# Patient Record
Sex: Male | Born: 1960
Health system: Southern US, Community
[De-identification: ages and names within clinical notes are randomized; demographics above are authoritative.]

## PROBLEM LIST (undated history)

## (undated) DIAGNOSIS — K219 Gastro-esophageal reflux disease without esophagitis: Secondary | ICD-10-CM

## (undated) DIAGNOSIS — J40 Bronchitis, not specified as acute or chronic: Secondary | ICD-10-CM

## (undated) DIAGNOSIS — R51 Headache: Secondary | ICD-10-CM

## (undated) DIAGNOSIS — M1990 Unspecified osteoarthritis, unspecified site: Secondary | ICD-10-CM

---

## 1999-02-02 ENCOUNTER — Emergency Department (HOSPITAL_COMMUNITY): Admission: EM | Admit: 1999-02-02 | Discharge: 1999-02-02 | Payer: Self-pay | Admitting: Emergency Medicine

## 2001-02-08 ENCOUNTER — Emergency Department (HOSPITAL_COMMUNITY): Admission: EM | Admit: 2001-02-08 | Discharge: 2001-02-08 | Payer: Self-pay

## 2001-07-22 ENCOUNTER — Emergency Department (HOSPITAL_COMMUNITY): Admission: EM | Admit: 2001-07-22 | Discharge: 2001-07-22 | Payer: Self-pay

## 2002-10-27 ENCOUNTER — Encounter: Payer: Self-pay | Admitting: Emergency Medicine

## 2002-10-27 ENCOUNTER — Emergency Department (HOSPITAL_COMMUNITY): Admission: EM | Admit: 2002-10-27 | Discharge: 2002-10-27 | Payer: Self-pay | Admitting: *Deleted

## 2007-08-28 ENCOUNTER — Emergency Department (HOSPITAL_COMMUNITY): Admission: EM | Admit: 2007-08-28 | Discharge: 2007-08-28 | Payer: Self-pay | Admitting: Emergency Medicine

## 2011-04-17 ENCOUNTER — Encounter: Payer: Self-pay | Admitting: *Deleted

## 2011-04-17 ENCOUNTER — Emergency Department (HOSPITAL_COMMUNITY)
Admission: EM | Admit: 2011-04-17 | Discharge: 2011-04-17 | Disposition: A | Discharge status: Home / Self Care | Payer: PRIVATE HEALTH INSURANCE | Attending: Emergency Medicine | Admitting: Emergency Medicine

## 2011-04-17 ENCOUNTER — Emergency Department (HOSPITAL_COMMUNITY): Payer: PRIVATE HEALTH INSURANCE

## 2011-04-17 DIAGNOSIS — M25559 Pain in unspecified hip: Secondary | ICD-10-CM | POA: Insufficient documentation

## 2011-04-17 DIAGNOSIS — M1610 Unilateral primary osteoarthritis, unspecified hip: Secondary | ICD-10-CM | POA: Insufficient documentation

## 2011-04-17 DIAGNOSIS — W010XXA Fall on same level from slipping, tripping and stumbling without subsequent striking against object, initial encounter: Secondary | ICD-10-CM | POA: Insufficient documentation

## 2011-04-17 MED ORDER — NAPROXEN 500 MG PO TABS: 500.0000 mg | ORAL_TABLET | Freq: Two times a day (BID) | ORAL | Status: DC
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
## 2011-04-17 NOTE — ED Provider Notes (Signed)
Medical screening examination/treatment/procedure(s) were performed by non-physician practitioner and as supervising physician I was immediately available for consultation/collaboration.  Nelia Shi, MD 04/17/11 [phone]

## 2011-04-17 NOTE — ED Notes (Signed)
Patient is resting comfortably. Back from xray  
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
## 2011-05-04 ENCOUNTER — Other Ambulatory Visit (HOSPITAL_COMMUNITY): Payer: Self-pay | Admitting: Orthopaedic Surgery

## 2011-05-18 ENCOUNTER — Other Ambulatory Visit (HOSPITAL_COMMUNITY): Payer: Self-pay | Admitting: Orthopaedic Surgery

## 2011-05-28 ENCOUNTER — Encounter (HOSPITAL_COMMUNITY): Payer: Self-pay

## 2011-06-03 ENCOUNTER — Encounter (HOSPITAL_COMMUNITY)
Admission: RE | Admit: 2011-06-03 | Discharge: 2011-06-03 | Disposition: A | Discharge status: Home / Self Care | Payer: PRIVATE HEALTH INSURANCE | Source: Ambulatory Visit | Attending: Orthopaedic Surgery | Admitting: Orthopaedic Surgery

## 2011-06-03 ENCOUNTER — Other Ambulatory Visit: Payer: Self-pay

## 2011-06-03 ENCOUNTER — Encounter (HOSPITAL_COMMUNITY): Payer: Self-pay

## 2011-06-03 ENCOUNTER — Ambulatory Visit (HOSPITAL_COMMUNITY)
Admission: RE | Admit: 2011-06-03 | Discharge: 2011-06-03 | Disposition: A | Discharge status: Home / Self Care | Payer: PRIVATE HEALTH INSURANCE | Source: Ambulatory Visit | Attending: Orthopaedic Surgery | Admitting: Orthopaedic Surgery

## 2011-06-03 DIAGNOSIS — J40 Bronchitis, not specified as acute or chronic: Secondary | ICD-10-CM

## 2011-06-03 DIAGNOSIS — K219 Gastro-esophageal reflux disease without esophagitis: Secondary | ICD-10-CM

## 2011-06-03 DIAGNOSIS — R51 Headache: Secondary | ICD-10-CM

## 2011-06-03 DIAGNOSIS — M1990 Unspecified osteoarthritis, unspecified site: Secondary | ICD-10-CM

## 2011-06-03 HISTORY — DX: Headache: R51

## 2011-06-03 HISTORY — DX: Bronchitis, not specified as acute or chronic: J40

## 2011-06-03 HISTORY — DX: Unspecified osteoarthritis, unspecified site: M19.90

## 2011-06-03 HISTORY — DX: Gastro-esophageal reflux disease without esophagitis: K21.9

## 2011-06-03 LAB — SURGICAL PCR SCREEN
MRSA, PCR: NEGATIVE
Staphylococcus aureus: NEGATIVE

## 2011-06-03 LAB — URINALYSIS, ROUTINE W REFLEX MICROSCOPIC
Bilirubin Urine: NEGATIVE
Leukocytes, UA: NEGATIVE
Nitrite: NEGATIVE
Specific Gravity, Urine: 1.009 (ref 1.005–1.030)
pH: 5.5 (ref 5.0–8.0)

## 2011-06-03 LAB — BASIC METABOLIC PANEL
CO2: 30 mEq/L (ref 19–32)
Calcium: 10.3 mg/dL (ref 8.4–10.5)
Chloride: 103 mEq/L (ref 96–112)
GFR calc Af Amer: >90 mL/min (ref >90)
Sodium: 141 mEq/L (ref 135–145)

## 2011-06-03 LAB — CBC
HCT: 42.2 % (ref 39.0–52.0)
MCV: 89.6 fL (ref 78.0–100.0)
Platelets: 283 10*3/uL (ref 150–400)
RBC: 4.71 MIL/uL (ref 4.22–5.81)
WBC: 5.4 10*3/uL (ref 4.0–10.5)

## 2011-06-03 LAB — ABO/RH: ABO/RH(D): O POS

## 2011-06-03 LAB — TYPE AND SCREEN: ABO/RH(D): O POS

## 2011-06-03 NOTE — Patient Instructions (Signed)
20 Keith Mejia  06/03/2011   Your procedure is scheduled on: 06-04-11  Report to [facility] at 0815 AM.  Call this number if you have problems the morning of surgery: [phone]   Remember:   Do not eat food:After Midnight.  May have clear liquids:until Midnight .  Clear liquids include soda, tea, black coffee, apple or grape juice, broth.  Take these medicines the morning of surgery with A SIP OF WATER:none.   Do not wear jewelry, make-up or nail polish.  Do not wear lotions, powders, or perfumes. You may wear deodorant.  Do not shave 48 hours prior to surgery.  Do not bring valuables to the hospital.  Contacts, dentures or bridgework may not be worn into surgery.  Leave suitcase in the car. After surgery it may be brought to your room.  For patients admitted to the hospital, checkout time is 11:00 AM the day of discharge.   Patients discharged the day of surgery will not be allowed to drive home.  Name and phone number of your driver:Keith Mejia, spouse [phone]  Special Instructions: CHG Shower Use Special Wash: 1/2 bottle night before surgery and 1/2 bottle morning of surgery.   Please read over the following fact sheets that you were given: Blood Transfusion Information and MRSA Information

## 2011-06-03 NOTE — Pre-Procedure Instructions (Signed)
06-03-11 EKG done CXR done today. Hip replacement booklet given.

## 2011-06-04 ENCOUNTER — Inpatient Hospital Stay (HOSPITAL_COMMUNITY): Payer: PRIVATE HEALTH INSURANCE

## 2011-06-04 ENCOUNTER — Encounter (HOSPITAL_COMMUNITY)
Admission: RE | Disposition: A | Discharge status: Home Health | Payer: Self-pay | Source: Ambulatory Visit | Attending: Orthopaedic Surgery

## 2011-06-04 ENCOUNTER — Inpatient Hospital Stay (HOSPITAL_COMMUNITY): Payer: PRIVATE HEALTH INSURANCE | Admitting: Anesthesiology

## 2011-06-04 ENCOUNTER — Encounter (HOSPITAL_COMMUNITY): Payer: Self-pay

## 2011-06-04 ENCOUNTER — Inpatient Hospital Stay (HOSPITAL_COMMUNITY)
Admission: RE | Admit: 2011-06-04 | Discharge: 2011-06-07 | DRG: 470 | Disposition: A | Discharge status: Home Health | Payer: PRIVATE HEALTH INSURANCE | Source: Ambulatory Visit | Attending: Orthopaedic Surgery | Admitting: Orthopaedic Surgery

## 2011-06-04 ENCOUNTER — Encounter (HOSPITAL_COMMUNITY): Payer: Self-pay | Admitting: Anesthesiology

## 2011-06-04 DIAGNOSIS — M12559 Traumatic arthropathy, unspecified hip: Principal | ICD-10-CM | POA: Diagnosis present

## 2011-06-04 DIAGNOSIS — M169 Osteoarthritis of hip, unspecified: Secondary | ICD-10-CM

## 2011-06-04 DIAGNOSIS — K219 Gastro-esophageal reflux disease without esophagitis: Secondary | ICD-10-CM | POA: Diagnosis present

## 2011-06-04 HISTORY — PX: TOTAL HIP ARTHROPLASTY: SHX124

## 2011-06-04 SURGERY — ARTHROPLASTY, HIP, TOTAL, ANTERIOR APPROACH
Anesthesia: General | Site: Hip | Laterality: Right | Wound class: Clean

## 2011-06-04 MED ORDER — ZOLPIDEM TARTRATE 5 MG PO TABS: 5.0000 mg | ORAL_TABLET | Freq: Every evening | ORAL | Status: DC | PRN

## 2011-06-04 MED ORDER — ACETAMINOPHEN 650 MG RE SUPP: 650.0000 mg | Freq: Four times a day (QID) | RECTAL | Status: DC | PRN

## 2011-06-04 MED ORDER — HYDROMORPHONE HCL PF 1 MG/ML IJ SOLN
INTRAMUSCULAR | Status: AC
  Filled 2011-06-04: qty 1

## 2011-06-04 MED ORDER — CEFAZOLIN SODIUM 1-5 GM-% IV SOLN
1.0000 g | Freq: Four times a day (QID) | INTRAVENOUS | Status: AC
  Administered 2011-06-04 – 2011-06-05 (×3): 1 g via INTRAVENOUS
  Filled 2011-06-04 (×3): qty 50

## 2011-06-04 MED ORDER — ONDANSETRON HCL 4 MG/2ML IJ SOLN
INTRAMUSCULAR | Status: DC | PRN
  Administered 2011-06-04: 4 mg via INTRAVENOUS

## 2011-06-04 MED ORDER — MIDAZOLAM HCL 5 MG/5ML IJ SOLN
INTRAMUSCULAR | Status: DC | PRN
  Administered 2011-06-04: 2 mg via INTRAVENOUS

## 2011-06-04 MED ORDER — PROMETHAZINE HCL 25 MG/ML IJ SOLN: 6.2500 mg | INTRAMUSCULAR | Status: DC | PRN

## 2011-06-04 MED ORDER — LIDOCAINE HCL (CARDIAC) 20 MG/ML IV SOLN
INTRAVENOUS | Status: DC | PRN
  Administered 2011-06-04: 100 mg via INTRAVENOUS

## 2011-06-04 MED ORDER — DIPHENHYDRAMINE HCL 12.5 MG/5ML PO ELIX: 12.5000 mg | ORAL_SOLUTION | Freq: Four times a day (QID) | ORAL | Status: DC | PRN

## 2011-06-04 MED ORDER — HYDROCODONE-ACETAMINOPHEN 5-325 MG PO TABS
1.0000 | ORAL_TABLET | ORAL | Status: DC | PRN
  Administered 2011-06-06 – 2011-06-07 (×3): 1 via ORAL
  Filled 2011-06-04 (×3): qty 1

## 2011-06-04 MED ORDER — METOCLOPRAMIDE HCL 10 MG PO TABS: 5.0000 mg | ORAL_TABLET | Freq: Three times a day (TID) | ORAL | Status: DC | PRN

## 2011-06-04 MED ORDER — DIPHENHYDRAMINE HCL 50 MG/ML IJ SOLN: 12.5000 mg | Freq: Four times a day (QID) | INTRAMUSCULAR | Status: DC | PRN

## 2011-06-04 MED ORDER — ONDANSETRON HCL 4 MG/2ML IJ SOLN: 4.0000 mg | Freq: Four times a day (QID) | INTRAMUSCULAR | Status: DC | PRN

## 2011-06-04 MED ORDER — METHOCARBAMOL 500 MG PO TABS
500.0000 mg | ORAL_TABLET | Freq: Four times a day (QID) | ORAL | Status: DC | PRN
  Administered 2011-06-05 – 2011-06-06 (×2): 500 mg via ORAL
  Filled 2011-06-04 (×2): qty 1

## 2011-06-04 MED ORDER — ALUM & MAG HYDROXIDE-SIMETH 200-200-20 MG/5ML PO SUSP: 30.0000 mL | ORAL | Status: DC | PRN

## 2011-06-04 MED ORDER — HYDROMORPHONE HCL PF 1 MG/ML IJ SOLN: 0.5000 mg | INTRAMUSCULAR | Status: DC | PRN

## 2011-06-04 MED ORDER — ACETAMINOPHEN 10 MG/ML IV SOLN
INTRAVENOUS | Status: AC
  Filled 2011-06-04: qty 100

## 2011-06-04 MED ORDER — CEFAZOLIN SODIUM-DEXTROSE 2-3 GM-% IV SOLR
2.0000 g | Freq: Once | INTRAVENOUS | Status: AC
  Administered 2011-06-04: 2 g via INTRAVENOUS

## 2011-06-04 MED ORDER — PHENOL 1.4 % MT LIQD: 1.0000 | OROMUCOSAL | Status: DC | PRN

## 2011-06-04 MED ORDER — FERROUS SULFATE 325 (65 FE) MG PO TABS
325.0000 mg | ORAL_TABLET | Freq: Three times a day (TID) | ORAL | Status: DC
  Administered 2011-06-05 – 2011-06-06 (×6): 325 mg via ORAL
  Filled 2011-06-04 (×10): qty 1

## 2011-06-04 MED ORDER — ACETAMINOPHEN 325 MG PO TABS
650.0000 mg | ORAL_TABLET | Freq: Four times a day (QID) | ORAL | Status: DC | PRN
  Administered 2011-06-05 – 2011-06-06 (×2): 650 mg via ORAL
  Filled 2011-06-04 (×2): qty 2

## 2011-06-04 MED ORDER — MORPHINE SULFATE (PF) 1 MG/ML IV SOLN
INTRAVENOUS | Status: DC
  Administered 2011-06-04: 24 mg via INTRAVENOUS
  Administered 2011-06-04: 12 mg via INTRAVENOUS
  Administered 2011-06-04: 6 mg via INTRAVENOUS
  Administered 2011-06-04: 1 mg via INTRAVENOUS
  Administered 2011-06-05: 18 mg via INTRAVENOUS
  Administered 2011-06-05: 17.65 mg via INTRAVENOUS
  Administered 2011-06-05: 1.5 mg via INTRAVENOUS
  Administered 2011-06-05: 4.5 mg via INTRAVENOUS
  Administered 2011-06-05: 16.25 mg via INTRAVENOUS
  Administered 2011-06-06: 3 mg via INTRAVENOUS
  Filled 2011-06-04 (×5): qty 25

## 2011-06-04 MED ORDER — NEOSTIGMINE METHYLSULFATE 1 MG/ML IJ SOLN
INTRAMUSCULAR | Status: DC | PRN
  Administered 2011-06-04: 5 mg via INTRAVENOUS

## 2011-06-04 MED ORDER — OXYCODONE HCL 5 MG PO TABS: 5.0000 mg | ORAL_TABLET | ORAL | Status: DC | PRN

## 2011-06-04 MED ORDER — ROCURONIUM BROMIDE 100 MG/10ML IV SOLN
INTRAVENOUS | Status: DC | PRN
  Administered 2011-06-04: 20 mg via INTRAVENOUS
  Administered 2011-06-04: 50 mg via INTRAVENOUS

## 2011-06-04 MED ORDER — DEXTROSE 5 % IV SOLN
500.0000 mg | Freq: Four times a day (QID) | INTRAVENOUS | Status: DC | PRN
  Administered 2011-06-04: 500 mg via INTRAVENOUS
  Filled 2011-06-04: qty 5

## 2011-06-04 MED ORDER — PROPOFOL 10 MG/ML IV BOLUS
INTRAVENOUS | Status: DC | PRN
  Administered 2011-06-04: 200 mg via INTRAVENOUS

## 2011-06-04 MED ORDER — ONDANSETRON HCL 4 MG PO TABS
4.0000 mg | ORAL_TABLET | Freq: Four times a day (QID) | ORAL | Status: DC | PRN
  Administered 2011-06-06: 4 mg via ORAL
  Filled 2011-06-04: qty 1

## 2011-06-04 MED ORDER — 0.9 % SODIUM CHLORIDE (POUR BTL) OPTIME
TOPICAL | Status: DC | PRN
  Administered 2011-06-04: 1000 mL

## 2011-06-04 MED ORDER — RIVAROXABAN 10 MG PO TABS
10.0000 mg | ORAL_TABLET | Freq: Every day | ORAL | Status: DC
  Administered 2011-06-05 – 2011-06-07 (×3): 10 mg via ORAL
  Filled 2011-06-04 (×3): qty 1

## 2011-06-04 MED ORDER — SODIUM CHLORIDE 0.9 % IV SOLN
INTRAVENOUS | Status: DC
  Administered 2011-06-04 (×2): via INTRAVENOUS

## 2011-06-04 MED ORDER — DIPHENHYDRAMINE HCL 12.5 MG/5ML PO ELIX: 12.5000 mg | ORAL_SOLUTION | ORAL | Status: DC | PRN

## 2011-06-04 MED ORDER — SODIUM CHLORIDE 0.9 % IJ SOLN: 9.0000 mL | INTRAMUSCULAR | Status: DC | PRN

## 2011-06-04 MED ORDER — ACETAMINOPHEN 10 MG/ML IV SOLN
INTRAVENOUS | Status: DC | PRN
  Administered 2011-06-04: 1000 mg via INTRAVENOUS

## 2011-06-04 MED ORDER — NALOXONE HCL 0.4 MG/ML IJ SOLN: 0.4000 mg | INTRAMUSCULAR | Status: DC | PRN

## 2011-06-04 MED ORDER — MENTHOL 3 MG MT LOZG: 1.0000 | LOZENGE | OROMUCOSAL | Status: DC | PRN

## 2011-06-04 MED ORDER — HYDROMORPHONE HCL PF 1 MG/ML IJ SOLN
0.2500 mg | INTRAMUSCULAR | Status: DC | PRN
  Administered 2011-06-04 (×4): 0.5 mg via INTRAVENOUS

## 2011-06-04 MED ORDER — LACTATED RINGERS IV SOLN
INTRAVENOUS | Status: DC
  Administered 2011-06-04 (×2): via INTRAVENOUS

## 2011-06-04 MED ORDER — CEFAZOLIN SODIUM-DEXTROSE 2-3 GM-% IV SOLR
INTRAVENOUS | Status: AC
  Filled 2011-06-04: qty 50

## 2011-06-04 MED ORDER — GLYCOPYRROLATE 0.2 MG/ML IJ SOLN
INTRAMUSCULAR | Status: DC | PRN
  Administered 2011-06-04: .8 mg via INTRAVENOUS

## 2011-06-04 MED ORDER — HYDROMORPHONE HCL PF 1 MG/ML IJ SOLN
INTRAMUSCULAR | Status: DC | PRN
  Administered 2011-06-04: 0.5 mg via INTRAVENOUS
  Administered 2011-06-04: 1 mg via INTRAVENOUS
  Administered 2011-06-04: 0.5 mg via INTRAVENOUS

## 2011-06-04 MED ORDER — METOCLOPRAMIDE HCL 5 MG/ML IJ SOLN
5.0000 mg | Freq: Three times a day (TID) | INTRAMUSCULAR | Status: DC | PRN
  Administered 2011-06-06: 10 mg via INTRAVENOUS
  Filled 2011-06-04: qty 2

## 2011-06-04 MED ORDER — LACTATED RINGERS IV SOLN: INTRAVENOUS | Status: DC

## 2011-06-04 MED ORDER — FENTANYL CITRATE 0.05 MG/ML IJ SOLN
INTRAMUSCULAR | Status: DC | PRN
  Administered 2011-06-04 (×5): 50 ug via INTRAVENOUS

## 2011-06-04 SURGICAL SUPPLY — 38 items
BAG SPEC THK2 15X12 ZIP CLS (MISCELLANEOUS) ×2
BAG ZIPLOCK 12X15 (MISCELLANEOUS) ×4 IMPLANT
BLADE SAW SGTL 18X1.27X75 (BLADE) ×2 IMPLANT
CELLS DAT CNTRL 66122 CELL SVR (MISCELLANEOUS) ×1 IMPLANT
CLOTH BEACON ORANGE TIMEOUT ST (SAFETY) ×2 IMPLANT
DRAPE C-ARM 42X72 X-RAY (DRAPES) ×2 IMPLANT
DRAPE STERI IOBAN 125X83 (DRAPES) ×2 IMPLANT
DRAPE U-SHAPE 47X51 STRL (DRAPES) ×6 IMPLANT
DRSG MEPILEX BORDER 4X8 (GAUZE/BANDAGES/DRESSINGS) ×2 IMPLANT
DRSG XEROFORM 1X8 (GAUZE/BANDAGES/DRESSINGS) ×1 IMPLANT
DURAPREP 26ML APPLICATOR (WOUND CARE) ×2 IMPLANT
ELECT BLADE TIP CTD 4 INCH (ELECTRODE) ×2 IMPLANT
ELECT REM PT RETURN 9FT ADLT (ELECTROSURGICAL) ×2
ELECTRODE REM PT RTRN 9FT ADLT (ELECTROSURGICAL) ×1 IMPLANT
EVACUATOR 1/8 PVC DRAIN (DRAIN) IMPLANT
FACESHIELD LNG OPTICON STERILE (SAFETY) ×8 IMPLANT
GAUZE XEROFORM 1X8 LF (GAUZE/BANDAGES/DRESSINGS) ×2 IMPLANT
GLOVE BIO SURGEON STRL SZ7 (GLOVE) ×2 IMPLANT
GLOVE BIO SURGEON STRL SZ7.5 (GLOVE) ×2 IMPLANT
GLOVE BIOGEL PI IND STRL 7.5 (GLOVE) IMPLANT
GLOVE BIOGEL PI IND STRL 8 (GLOVE) ×1 IMPLANT
GLOVE BIOGEL PI INDICATOR 7.5 (GLOVE)
GLOVE BIOGEL PI INDICATOR 8 (GLOVE) ×1
GLOVE ECLIPSE 7.0 STRL STRAW (GLOVE) ×2 IMPLANT
GOWN STRL REIN XL XLG (GOWN DISPOSABLE) ×4 IMPLANT
KIT BASIN OR (CUSTOM PROCEDURE TRAY) ×2 IMPLANT
PACK TOTAL JOINT (CUSTOM PROCEDURE TRAY) ×2 IMPLANT
PADDING CAST COTTON 6X4 STRL (CAST SUPPLIES) ×2 IMPLANT
RETRACTOR WND ALEXIS 18 MED (MISCELLANEOUS) ×1 IMPLANT
RTRCTR WOUND ALEXIS 18CM MED (MISCELLANEOUS) ×2
STAPLER SKIN PROX WIDE 3.9 (STAPLE) IMPLANT
SUT ETHIBOND NAB CT1 #1 30IN (SUTURE) ×4 IMPLANT
SUT VIC AB 1 CT1 36 (SUTURE) ×4 IMPLANT
SUT VIC AB 2-0 CT1 27 (SUTURE) ×4
SUT VIC AB 2-0 CT1 TAPERPNT 27 (SUTURE) ×2 IMPLANT
TOWEL OR 17X26 10 PK STRL BLUE (TOWEL DISPOSABLE) ×4 IMPLANT
TOWEL OR NON WOVEN STRL DISP B (DISPOSABLE) ×2 IMPLANT
TRAY FOLEY CATH 14FRSI W/METER (CATHETERS) ×2 IMPLANT

## 2011-06-04 NOTE — Brief Op Note (Signed)
06/04/2011  11:54 AM  PATIENT:  Keith Mejia  50 y.o. male  PRE-OPERATIVE DIAGNOSIS:  Right hip severe osteoarthritis  POST-OPERATIVE DIAGNOSIS:  Right hip severe osteoarthritis  PROCEDURE:  Procedure(s): TOTAL HIP ARTHROPLASTY ANTERIOR APPROACH  SURGEON:  Surgeon(s): Kathryne Hitch  PHYSICIAN ASSISTANT:   ASSISTANTS: none   ANESTHESIA:   general  EBL:  Total I/O In: 1000 [I.V.:1000] Out: 500 [Urine:200; Blood:300]  BLOOD ADMINISTERED:none  DRAINS: none   LOCAL MEDICATIONS USED:  NONE  SPECIMEN:  No Specimen  DISPOSITION OF SPECIMEN:  N/A  COUNTS:  YES  TOURNIQUET:  * No tourniquets in log *  DICTATION: .Other Dictation: Dictation Number [id number]  PLAN OF CARE: Admit to inpatient   PATIENT DISPOSITION:  PACU - hemodynamically stable.   Delay start of Pharmacological VTE agent (>24hrs) due to surgical blood loss or risk of bleeding:  {YES/NO/NOT APPLICABLE:20182

## 2011-06-04 NOTE — Transfer of Care (Signed)
Immediate Anesthesia Transfer of Care Note  Patient: Keith Mejia  Procedure(s) Performed:  TOTAL HIP ARTHROPLASTY ANTERIOR APPROACH - Right Total Hip Arthroplasty, Direct Anterior Approach    (c-arm)  Patient Location: PACU  Anesthesia Type: General  Level of Consciousness: sedated, patient cooperative and responds to stimulaton  Airway & Oxygen Therapy: Patient Spontanous Breathing and Patient connected to face mask oxgen  Post-op Assessment: Report given to PACU RN and Post -op Vital signs reviewed and stable  Post vital signs: Reviewed and stable  Complications: No apparent anesthesia complications

## 2011-06-04 NOTE — Plan of Care (Signed)
Problem: Consults Goal: Diagnosis- Total Joint Replacement Right total hip anterior     

## 2011-06-04 NOTE — Anesthesia Postprocedure Evaluation (Signed)
Anesthesia Post Note  Patient: Keith Mejia  Procedure(s) Performed:  TOTAL HIP ARTHROPLASTY ANTERIOR APPROACH - Right Total Hip Arthroplasty, Direct Anterior Approach    (c-arm)  Anesthesia type: General  Patient location: PACU  Post pain: Pain level controlled  Post assessment: Post-op Vital signs reviewed  Last Vitals:  Filed Vitals:   06/04/11 1245  BP: 143/83  Pulse: 58  Temp:   Resp: 7    Post vital signs: Reviewed  Level of consciousness: sedated  Complications: No apparent anesthesia complications

## 2011-06-04 NOTE — Anesthesia Postprocedure Evaluation (Signed)
Anesthesia Post Note  Patient: Keith Mejia  Procedure(s) Performed:  TOTAL HIP ARTHROPLASTY ANTERIOR APPROACH - Right Total Hip Arthroplasty, Direct Anterior Approach    (c-arm)  Anesthesia type: General  Patient location: PACU  Post pain: Pain level controlled  Post assessment: Post-op Vital signs reviewed  Last Vitals:  Filed Vitals:   06/04/11 2003  BP:   Pulse:   Temp:   Resp: 12    Post vital signs: Reviewed  Level of consciousness: sedated  Complications: No apparent anesthesia complications
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
## 2011-06-04 NOTE — Anesthesia Preprocedure Evaluation (Signed)
Anesthesia Evaluation  Patient identified by MRN, date of birth, ID band Patient awake    Reviewed: Allergy & Precautions, H&P , NPO status , Patient's Chart, lab work & pertinent test results  Airway Mallampati: II TM Distance: >3 FB Neck ROM: Full    Dental  (+) Poor Dentition and Dental Advisory Given,    Pulmonary neg pulmonary ROS,  clear to auscultation  Pulmonary exam normal       Cardiovascular neg cardio ROS Regular Normal    Neuro/Psych Negative Neurological ROS  Negative Psych ROS   GI/Hepatic negative GI ROS, Neg liver ROS, GERD-  ,  Endo/Other  Negative Endocrine ROS  Renal/GU negative Renal ROS  Genitourinary negative   Musculoskeletal negative musculoskeletal ROS (+)   Abdominal Normal abdominal exam  (+)   Peds negative pediatric ROS (+)  Hematology negative hematology ROS (+)   Anesthesia Other Findings   Reproductive/Obstetrics negative OB ROS                           Anesthesia Physical Anesthesia Plan  ASA: I  Anesthesia Plan: General   Post-op Pain Management:    Induction: Intravenous  Airway Management Planned: Oral ETT  Additional Equipment:   Intra-op Plan:   Post-operative Plan: Extubation in OR  Informed Consent: I have reviewed the patients History and Physical, chart, labs and discussed the procedure including the risks, benefits and alternatives for the proposed anesthesia with the patient or authorized representative who has indicated his/her understanding and acceptance.   Dental advisory given  Plan Discussed with:   Anesthesia Plan Comments:         Anesthesia Quick Evaluation
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
## 2011-06-05 LAB — CBC
MCHC: 32.8 g/dL (ref 30.0–36.0)
Platelets: 248 10*3/uL (ref 150–400)
RDW: 14.6 % (ref 11.5–15.5)
WBC: 8.1 10*3/uL (ref 4.0–10.5)

## 2011-06-05 LAB — BASIC METABOLIC PANEL
Chloride: 102 mEq/L (ref 96–112)
GFR calc Af Amer: >90 mL/min (ref >90)
GFR calc non Af Amer: 82 mL/min — ABNORMAL LOW (ref >90)
Potassium: 3.7 mEq/L (ref 3.5–5.1)

## 2011-06-05 NOTE — Progress Notes (Signed)
OT Screen:  Pt was screened for OT services. Pt has a low commode but feels he will be OK with this.  Discussed tub readiness and reviewed technique to step into tub.   No acute OT needs were identified.  Will sign off. [place], [place] [phone] 06/05/2011

## 2011-06-05 NOTE — Progress Notes (Signed)
Physical Therapy Treatment Patient Details Name: Keith Mejia MRN: [id number] DOB: 04/19/1961 Today's Date: 06/05/2011 Time: 4098-1191 Charge: TE PT Assessment/Plan  PT - Assessment/Plan Comments on Treatment Session: Pt educated on and performed exercises. PT Plan: Discharge plan remains appropriate PT Frequency: 7X/week Follow Up Recommendations: Home health PT Equipment Recommended: Rolling walker with 5" wheels;Toilet riser PT Goals  Acute Rehab PT Goals PT Goal Formulation: With patient Time For Goal Achievement: 3 days Pt will Perform Home Exercise Program: Independently PT Goal: Perform Home Exercise Program - Progress: Progressing toward goal  PT Treatment Precautions/Restrictions  Precautions Precaution Comments: No hip precautions - anterior direct approach Restrictions Weight Bearing Restrictions: Yes RLE Weight Bearing: Weight bearing as tolerated Mobility (including Balance) Bed Mobility Bed Mobility: No   Exercise  Total Joint Exercises Ankle Circles/Pumps: AROM;Both;20 reps;Supine Quad Sets: Supine;10 reps;Right;Strengthening Gluteal Sets: Strengthening;Both;Other reps (comment);Supine (20 reps) Short Arc Quad: AROM;Strengthening;Right;Other reps (comment);Supine (15 reps) Heel Slides: Strengthening;Right;15 reps;Supine;AROM Hip ABduction/ADduction: AAROM;Strengthening;Right;15 reps;Supine Straight Leg Raises:  (pt unable) End of Session PT - End of Session Activity Tolerance: Patient tolerated treatment well Patient left: in bed;with call bell in reach General Behavior During Session: [facility] for tasks performed Cognition: [facility] for tasks performed  Titiana Severa,KATHrine E 06/05/2011, 3:12 PM Pager: [phone]

## 2011-06-05 NOTE — Progress Notes (Signed)
Subjective: 1 Day Post-Op Procedure(s) (LRB): TOTAL HIP ARTHROPLASTY ANTERIOR APPROACH (Right) Patient reports pain as 7 on 0-10 scale.    Objective: Vital signs in last 24 hours: Temp:  [97.7 F (36.5 C)-100.6 F (38.1 C)] 100.6 F (38.1 C) (12/29 1120) Pulse Rate:  [58-111] 111  (12/29 1120) Resp:  [12-20] 16  (12/29 1120) BP: (109-156)/(67-93) 126/82 mmHg (12/29 1120) SpO2:  [92 %-100 %] 97 % (12/29 1212) Weight:  [85.73 kg (189 lb)] 189 lb (85.73 kg) (12/28 1325)  Intake/Output from previous day: 12/28 0701 - 12/29 0700 In: 3543.8 [P.O.:840; I.V.:2703.8] Out: 1385 [Urine:1085; Blood:300] Intake/Output this shift: Total I/O In: 240 [P.O.:240] Out: -    Basename 06/05/11 0443 06/03/11 0940  HGB 11.6* 13.6    Basename 06/05/11 0443 06/03/11 0940  WBC 8.1 5.4  RBC 3.95* 4.71  HCT 35.4* 42.2  PLT 248 283    Basename 06/05/11 0443 06/03/11 0940  NA 138 141  K 3.7 4.2  CL 102 103  CO2 27 30  BUN 11 12  CREATININE 1.04 1.03  GLUCOSE 110* 77  CALCIUM 8.9 10.3   No results found for this basename: LABPT:2,INR:2 in the last 72 hours  Neurologically intact ABD soft Neurovascular intact Sensation intact distally Intact pulses distally Dorsiflexion/Plantar flexion intact Incision: dressing C/D/I  Assessment/Plan: 1 Day Post-Op Procedure(s) (LRB): TOTAL HIP ARTHROPLASTY ANTERIOR APPROACH (Right)   Plan:1) TKO IVF 2)PT OT 3) Incentive spirometry  Loyd Marhefka E 06/05/2011, 12:42 PM

## 2011-06-05 NOTE — Progress Notes (Signed)
Physical Therapy Evaluation Patient Details Name: Keith Mejia MRN: [id number] DOB: [birth year] Today's Date: 06/05/2011 Time: 1011-1025 Charge: EVII  Problem List:  Patient Active Problem List  Diagnoses  . Degenerative arthritis of hip    Past Medical History:  Past Medical History  Diagnosis Date  . Bronchitis 06-03-11    x1 -a few yrs ago, none recent  . GERD (gastroesophageal reflux disease) 06-03-11    as needed Prilosec OTC  . Headache 06-03-11    sinus related, none recent  . Arthritis 06-03-11    osteoarthritis-rt. hip, lt shoulder   Past Surgical History: History reviewed. No pertinent past surgical history.  PT Assessment/Plan/Recommendation PT Assessment Clinical Impression Statement: Pt would benefit from skilled PT services in order to promote safety and independence with mobility including gait and stairs to prepare for d/c home with spouse.  Pt did very well today and hopes to d/c home very soon. PT Recommendation/Assessment: Patient will need skilled PT in the acute care venue PT Problem List: Decreased strength;Decreased mobility;Decreased safety awareness;Decreased knowledge of use of DME PT Therapy Diagnosis : Difficulty walking;Acute pain PT Plan PT Frequency: 7X/week PT Treatment/Interventions: DME instruction;Gait training;Stair training;Functional mobility training;Therapeutic activities;Therapeutic exercise;Patient/family education PT Recommendation Follow Up Recommendations: Home health PT Equipment Recommended: Rolling walker with 5" wheels;Toilet riser PT Goals  Acute Rehab PT Goals PT Goal Formulation: With patient Time For Goal Achievement: 3 days Pt will go Sit to Stand: with modified independence PT Goal: Sit to Stand - Progress: Not met Pt will go Stand to Sit: with modified independence PT Goal: Stand to Sit - Progress: Not met Pt will Ambulate: >150 feet;with modified independence;with least restrictive assistive device PT Goal:  Ambulate - Progress: Not met Pt will Go Up / Down Stairs: 1-2 stairs;with rail(s) PT Goal: Up/Down Stairs - Progress: Not met Pt will Perform Home Exercise Program: Independently PT Goal: Perform Home Exercise Program - Progress: Not met  PT Evaluation Precautions/Restrictions  Precautions Precaution Comments: No hip precautions - anterior direct approach Restrictions Weight Bearing Restrictions: Yes RLE Weight Bearing: Weight bearing as tolerated Prior Functioning  Home Living Lives With: Spouse Type of Home: House Home Layout: One level Home Access: Stairs to enter Entrance Stairs-Rails: Can reach both Entrance Stairs-Number of Steps: 1 Home Adaptive Equipment: None Prior Function Level of Independence: Independent with basic ADLs;Independent with gait;Independent with transfers Cognition   Sensation/Coordination Sensation Light Touch: Appears Intact Extremity Assessment RLE Strength RLE Overall Strength Comments: at least 3/5 with functional observation except hip flexion 2+/5 LLE Assessment LLE Assessment: Within Functional Limits Mobility (including Balance) Bed Mobility Bed Mobility: Yes Supine to Sit: 5: Supervision Supine to Sit Details (indicate cue type and reason): pt required use of UEs to assist R LE off bed Transfers Transfers: Yes Sit to Stand: 5: Supervision;From bed Sit to Stand Details (indicate cue type and reason): pt impulsive and tried to stand immediately after getting to EOB Stand to Sit: 5: Supervision;With armrests;To chair/3-in-1 Stand to Sit Details: verbal cues for safe technique Ambulation/Gait Ambulation/Gait: Yes Ambulation/Gait Assistance: 4: Min assist Ambulation/Gait Assistance Details (indicate cue type and reason): verbal cues to slow cadence as pt required minA for LOB x1 and cued to decrease speed for safety Ambulation Distance (Feet): 200 Feet Assistive device: Rolling walker Gait Pattern: Step-through pattern;Decreased  hip/knee flexion - right    Exercise    End of Session PT - End of Session Activity Tolerance: Patient tolerated treatment well Patient left: in chair;with call bell in reach;with  family/visitor present General Behavior During Session: [facility] for tasks performed Cognition: [facility] for tasks performed  Karo Rog,KATHrine E 06/05/2011, 12:27 PM Pager: [phone]

## 2011-06-06 LAB — CBC
HCT: 32.7 % — ABNORMAL LOW (ref 39.0–52.0)
Hemoglobin: 11 g/dL — ABNORMAL LOW (ref 13.0–17.0)
RDW: 14 % (ref 11.5–15.5)
WBC: 8.1 10*3/uL (ref 4.0–10.5)

## 2011-06-06 NOTE — Progress Notes (Signed)
PT Cancellation Note   Treatment cancelled today due to patient's refusal to participate as patient feels nauseated.  Patient has been up in room multiple x during the day.  Reviewed his exercises with him.  Will return at a later date.  THanks. [facility] [phone] (pager)

## 2011-06-06 NOTE — Progress Notes (Signed)
Subjective: 2 Days Post-Op Procedure(s) (LRB): TOTAL HIP ARTHROPLASTY ANTERIOR APPROACH (Right) Complains of some bloody nasal discharge when blowing nose today, no active bleeding, no sore throat or cough or chest pain. Patient reports pain as mild.    Objective:   VITALS:  Temp:  [99.1 F (37.3 C)-100.6 F (38.1 C)] 99.1 F (37.3 C) (12/30 0546) Pulse Rate:  [79-111] 86  (12/30 0546) Resp:  [16-18] 16  (12/30 0546) BP: (118-126)/(69-82) 124/75 mmHg (12/30 0546) SpO2:  [92 %-97 %] 96 % (12/30 0546)  Neurologically intact ABD soft Neurovascular intact Sensation intact distally Intact pulses distally Dorsiflexion/Plantar flexion intact Incision: no drainage Dressing changed, no drainage or erythrema.  LABS  Basename 06/06/11 0420 06/05/11 0443 06/03/11 0940  HGB 11.0* 11.6* 13.6  WBC 8.1 8.1 --  PLT 204 248 --    Basename 06/05/11 0443 06/03/11 0940  NA 138 141  K 3.7 4.2  CL 102 103  CO2 27 30  BUN 11 12  CREATININE 1.04 1.03  GLUCOSE 110* 77   No results found for this basename: LABPT:2,INR:2 in the last 72 hours   Assessment/Plan: 2 Days Post-Op Procedure(s) (LRB): TOTAL HIP ARTHROPLASTY ANTERIOR APPROACH (Right) Bloody nasal discharge likely old clot from NGT during surgery, expect This to completely resolve.,patient reassured   Advance diet Up with therapy D/C IV fluids Plan for discharge tomorrow D/C Planning. Labib Cwynar E 06/06/2011, 9:25 AM

## 2011-06-07 ENCOUNTER — Encounter (HOSPITAL_COMMUNITY): Payer: Self-pay | Admitting: Orthopaedic Surgery

## 2011-06-07 LAB — CBC
Hemoglobin: 10.7 g/dL — ABNORMAL LOW (ref 13.0–17.0)
MCH: 28.9 pg (ref 26.0–34.0)
MCV: 86.5 fL (ref 78.0–100.0)
Platelets: 216 10*3/uL (ref 150–400)
RBC: 3.7 MIL/uL — ABNORMAL LOW (ref 4.22–5.81)
WBC: 8.1 10*3/uL (ref 4.0–10.5)

## 2011-06-07 MED ORDER — HYDROCODONE-ACETAMINOPHEN 5-325 MG PO TABS: 1.0000 | ORAL_TABLET | Freq: Four times a day (QID) | ORAL | Status: AC | PRN

## 2011-06-07 NOTE — Progress Notes (Signed)
Physical Therapy Treatment Patient Details Name: Keith Mejia MRN: [id number] DOB: May 06, 1961 Today's Date: 06/07/2011  [id number]  G, TE  PT Assessment/Plan  PT - Assessment/Plan Comments on Treatment Session: Pt tolerated ambulation/stairs and exercises well today with no increase in pain.  Pt and wife educated on proper technique/sequencing when ascending/descending stairs.  Pt to D/C today and receive HHPT for follow up therapy.  PT Plan: Discharge plan remains appropriate PT Frequency: 7X/week Follow Up Recommendations: Home health PT Equipment Recommended: Rolling walker with 5" wheels;Toilet riser PT Goals  Acute Rehab PT Goals PT Goal Formulation: With patient Time For Goal Achievement: 3 days Pt will go Sit to Stand: with modified independence PT Goal: Sit to Stand - Progress: Progressing toward goal Pt will go Stand to Sit: with modified independence PT Goal: Stand to Sit - Progress: Progressing toward goal Pt will Ambulate: >150 feet;with modified independence;with least restrictive assistive device PT Goal: Ambulate - Progress: Progressing toward goal Pt will Go Up / Down Stairs: 1-2 stairs;with rail(s) PT Goal: Up/Down Stairs - Progress: Met Pt will Perform Home Exercise Program: Independently PT Goal: Perform Home Exercise Program - Progress: Met  PT Treatment Precautions/Restrictions  Precautions Precaution Comments: No hip precautions - anterior direct approach Restrictions Weight Bearing Restrictions: No RLE Weight Bearing: Weight bearing as tolerated Mobility (including Balance) Bed Mobility Bed Mobility: Yes Supine to Sit: 5: Supervision Supine to Sit Details (indicate cue type and reason): Requires cues for RLE management Transfers Transfers: Yes Sit to Stand: 5: Supervision;From bed;From chair/3-in-1;With armrests;With upper extremity assist Sit to Stand Details (indicate cue type and reason): Requires cues for safety due to impulsivity Stand to Sit:  5: Supervision;With upper extremity assist;To chair/3-in-1;To bed;With armrests Stand to Sit Details: requires cues for safety with proper technique Ambulation/Gait Ambulation/Gait: Yes Ambulation/Gait Assistance: 5: Supervision Ambulation/Gait Assistance Details (indicate cue type and reason): requires cues for proper placement of RW, cues to maintain trunk in upright alighnment due to tendency to lean forward when bearing weight.  Also requires cues when turning for safety.   Ambulation Distance (Feet): 100 Feet Assistive device: Rolling walker Gait Pattern: Step-through pattern;Trunk flexed Stairs: Yes Stairs Assistance: 4: Min assist Stairs Assistance Details (indicate cue type and reason): Min/Guard for descending stairs.  cues required for proper technique/sequencing with RW and LE placement.  Wife present to learn proper technique.  Stair Management Technique: No rails;Backwards;With walker;Step to pattern Number of Stairs: 4  Height of Stairs: 6     Exercise  Total Joint Exercises Ankle Circles/Pumps: AROM;Both;15 reps;Supine Quad Sets: AROM;Right;15 reps;Supine Gluteal Sets: Strengthening;Both;Other reps (comment) (15 reps) Heel Slides: Strengthening;Right;15 reps;Supine;AROM Straight Leg Raises: AROM;Right;15 reps;Seated Knee Flexion: AROM;Both;15 reps;Standing Marching in Standing: AROM;Both;10 reps;Standing End of Session PT - End of Session Activity Tolerance: Patient tolerated treatment well Patient left: in chair;with call bell in reach;with family/visitor present General Behavior During Session: [facility] for tasks performed Cognition: [facility] for tasks performed  Page, Meribeth Mattes 06/07/2011, 9:56 AM

## 2011-06-07 NOTE — Progress Notes (Signed)
Utilization review completed.  

## 2011-06-07 NOTE — Progress Notes (Signed)
06/07/2011 Raynelle Bring BSN CCM [phone] Cm spoke with patient and spouse.  Plans are for patient to return to his home in [place], [place] where spouse will be caregiver. Pt will need RW and 3N1. He would like [facility] agency that is in network. [facility] will be able to provide DME and HH services-HHPT . Start of services tomorow 06/08/2011

## 2011-06-07 NOTE — Discharge Summary (Signed)
Physician Discharge Summary  Patient ID: Keith Mejia MRN: [id number] DOB/AGE: [birth year] 50 y.o.  Admit date: 06/04/2011 Discharge date: 06/07/2011  Admission Diagnoses:  Degenerative arthritis of hip  Discharge Diagnoses:  Active Problems:  * No active hospital problems. *    Past Medical History  Diagnosis Date  . Bronchitis 06-03-11    x1 -a few yrs ago, none recent  . GERD (gastroesophageal reflux disease) 06-03-11    as needed Prilosec OTC  . Headache 06-03-11    sinus related, none recent  . Arthritis 06-03-11    osteoarthritis-rt. hip, lt shoulder    Surgeries: Procedure(s): TOTAL HIP ARTHROPLASTY ANTERIOR APPROACH on 06/04/2011   Consultants (if any):    Discharged Condition: Improved  Hospital Course: Keith Mejia is an 50 y.o. male who was admitted 06/04/2011 with a diagnosis of Degenerative arthritis of hip and went to the operating room on 06/04/2011 and underwent the above named procedures.    He was given perioperative antibiotics:  Anti-infectives     Start     Dose/Rate Route Frequency Ordered Stop   06/04/11 1500   ceFAZolin (ANCEF) IVPB 1 g/50 mL premix        1 g 100 mL/hr over 30 Minutes Intravenous Every 6 hours 06/04/11 1333 06/05/11 0349   06/04/11 0845   ceFAZolin (ANCEF) IVPB 2 g/50 mL premix        2 g 100 mL/hr over 30 Minutes Intravenous  Once 06/04/11 0832 06/04/11 1015        .  He was given sequential compression devices, early ambulation, and chemoprophylaxis for DVT prophylaxis.  He benefited maximally from their hospital stay and there were no complications.    Recent vital signs:  Filed Vitals:   06/07/11 0538  BP: 131/84  Pulse: 79  Temp: 99 F (37.2 C)  Resp: 16    Recent laboratory studies:  Lab Results  Component Value Date   HGB 10.7* 06/07/2011   HGB 11.0* 06/06/2011   HGB 11.6* 06/05/2011   Lab Results  Component Value Date   WBC 8.1 06/07/2011   PLT 216 06/07/2011   No results found for  this basename: INR   Lab Results  Component Value Date   NA 138 06/05/2011   K 3.7 06/05/2011   CL 102 06/05/2011   CO2 27 06/05/2011   BUN 11 06/05/2011   CREATININE 1.04 06/05/2011   GLUCOSE 110* 06/05/2011    Discharge Medications:   Current Discharge Medication List    START taking these medications   Details  HYDROcodone-acetaminophen (NORCO) 5-325 MG per tablet Take 1-2 tablets by mouth every 6 (six) hours as needed for pain. Qty: 60 tablet, Refills: 1      CONTINUE these medications which have NOT CHANGED   Details  methocarbamol (ROBAXIN) 500 MG tablet Take 500 mg by mouth 2 (two) times daily as needed. For pain/spasms       STOP taking these medications     meloxicam (MOBIC) 15 MG tablet      traMADol (ULTRAM) 50 MG tablet         Diagnostic Studies:  Chest 2 View  06/03/2011  *RADIOLOGY REPORT*  Clinical Data: 50 year old male preoperative study.  CHEST - 2 VIEW  Comparison: None.  Findings: Lung volumes at the upper limits of normal.  Cardiac size and mediastinal contours are within normal limits.  Visualized tracheal air column is within normal limits.  The lungs are clear. No acute osseous abnormality identified.  IMPRESSION: Negative, no acute cardiopulmonary abnormality.  Original Report Authenticated By: Harley Hallmark, M.D.   Dg Hip 1 View Right  06/04/2011  *RADIOLOGY REPORT*  Clinical Data: Postop  RIGHT HIP - 1 VIEW  Comparison: None.  Findings: Right total hip arthroplasty is anatomically aligned.  No breakage or loosening of the hardware.  IMPRESSION: Right total arthroplasty anatomically aligned.  Original Report Authenticated By: Donavan Burnet, M.D.   Dg Hip Complete Right  06/04/2011  *RADIOLOGY REPORT*  Clinical Data: Hip pain  RIGHT HIP - COMPLETE 2+ VIEW  Comparison: None.  Findings: C-arm films document satisfactory position and alignment status post right total hip replacement.  No adverse features.  IMPRESSION: As above.  Original Report  Authenticated By: Elsie Stain, M.D.   Dg Pelvis Portable  06/04/2011   *RADIOLOGY REPORT*  Clinical Data: Hip pain  PORTABLE PELVIS  Comparison: 04/17/2011  Findings: The patient is status post right total hip replacement. There is satisfactory position and alignment of the femoral and acetabular components.  Soft tissue swelling is noted laterally.  IMPRESSION: Status post right T H R.  No adverse features.  Original Report Authenticated By: Elsie Stain, M.D.   Dg C-arm 61-120 Min-no Report  06/04/2011  CLINICAL DATA: anterior hip   C-ARM 61-120 MINUTES  Fluoroscopy was utilized by the requesting physician.  No radiographic  interpretation.      Disposition: Home or Self Care       Signed: Kathryne Hitch 06/07/2011, 7:07 AM

## 2012-09-07 IMAGING — CR DG CHEST 2V
2 series · 2 of 2 positions shown · non-contrast
Comparison: None.

CLINICAL DATA: 50-year-old male preoperative study.

CHEST - 2 VIEW

[w chest pa]
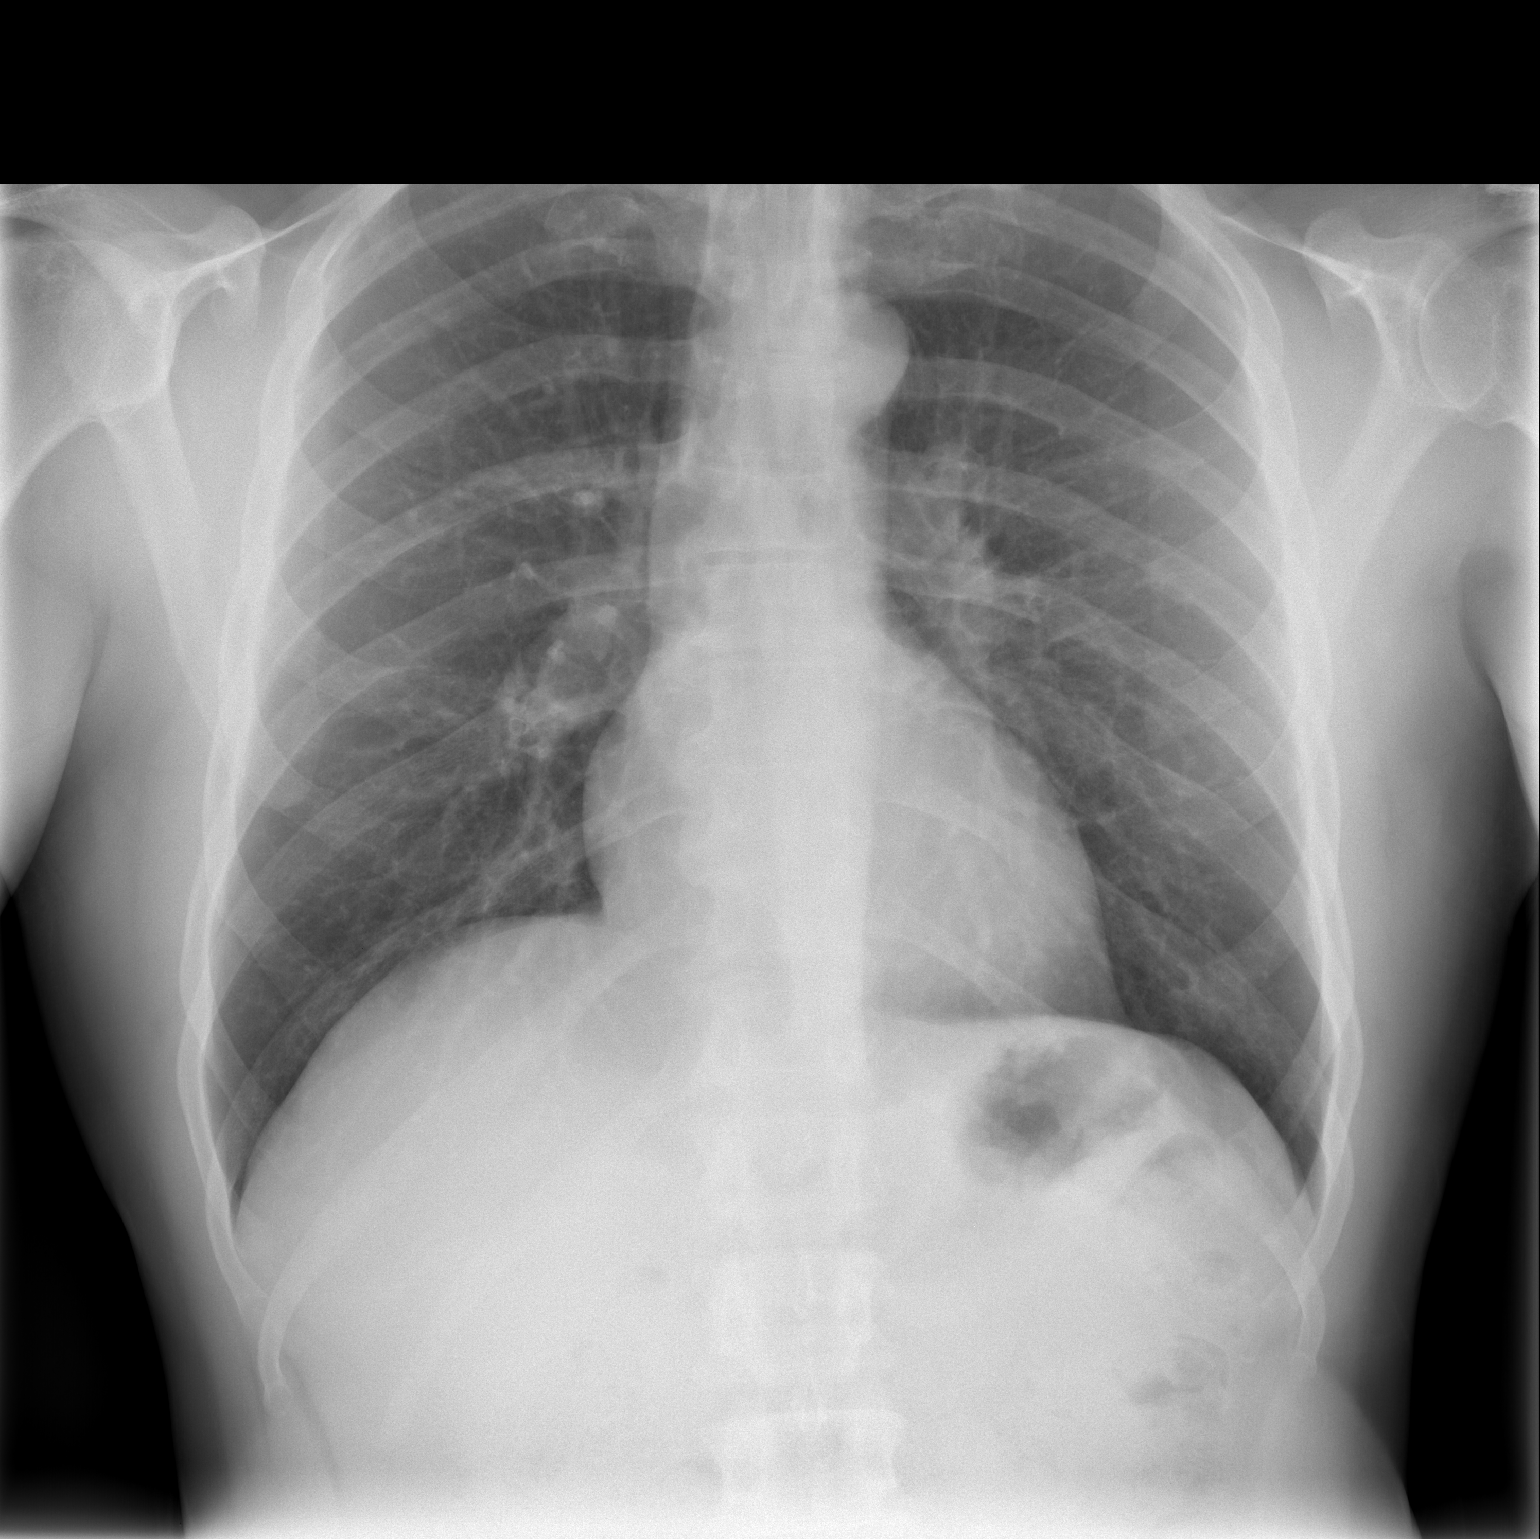

[w chest lat]
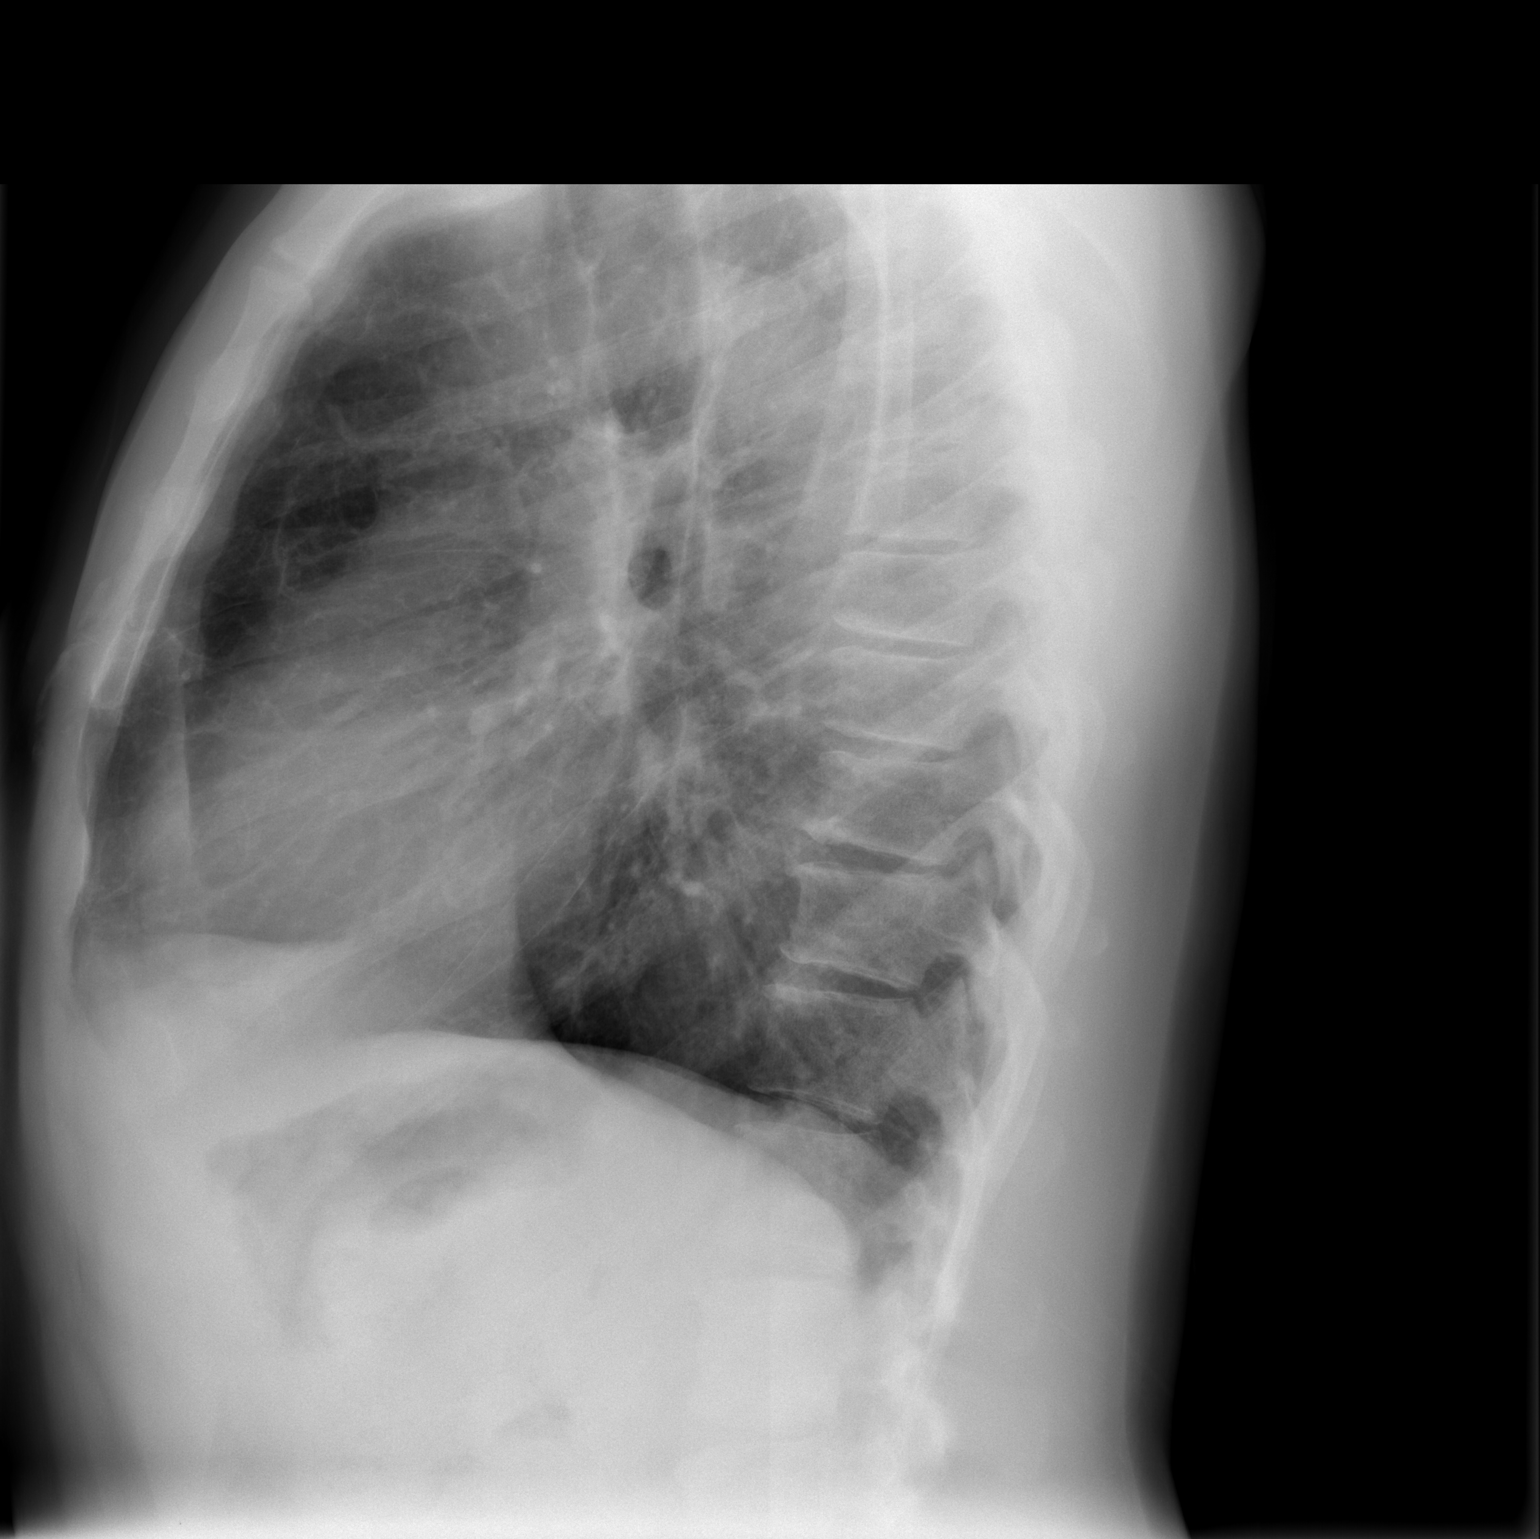

[2 of 2 positions shown; findings below may reference images not displayed]

FINDINGS: Lung volumes at the upper limits of normal.  Cardiac size
and mediastinal contours are within normal limits.  Visualized
tracheal air column is within normal limits.  The lungs are clear.
No acute osseous abnormality identified.
IMPRESSION: Negative, no acute cardiopulmonary abnormality.

## 2012-09-08 IMAGING — CR DG HIP 1V*R*
1 series · 2 of 2 positions shown · non-contrast
Comparison: None.

CLINICAL DATA: Postop

RIGHT HIP - 1 VIEW

[Series 1: lateral (frog) · right · 2 of 2 slices shown]
[im 1/2]
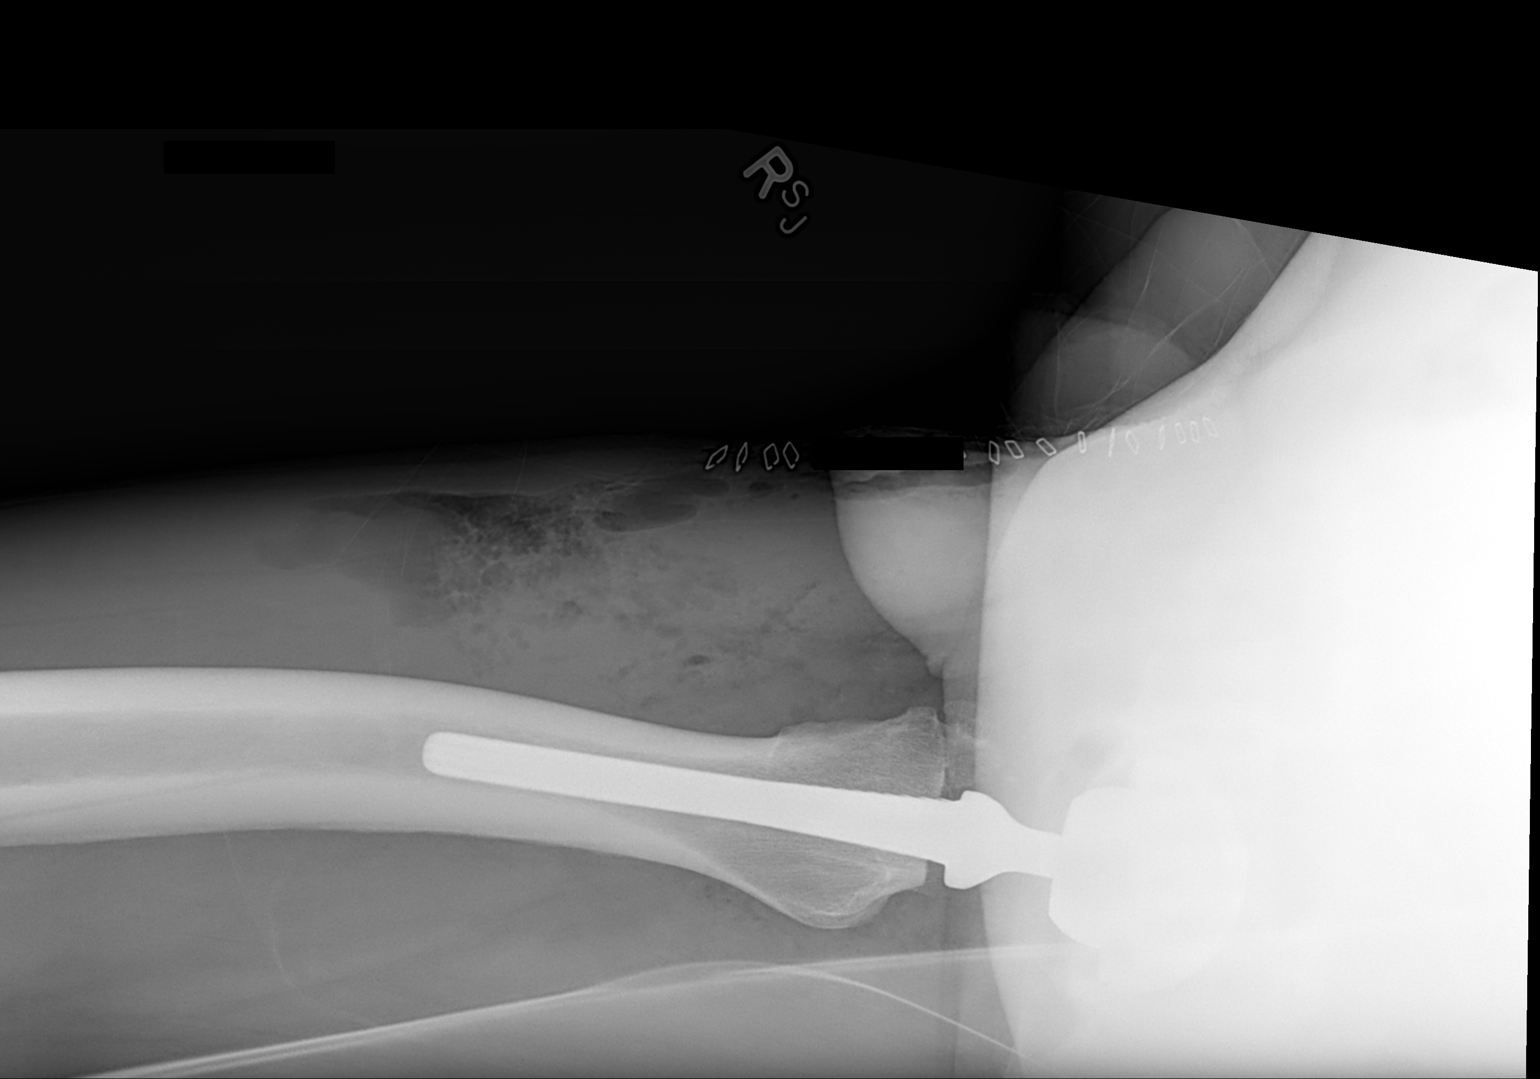
[im 2/2]
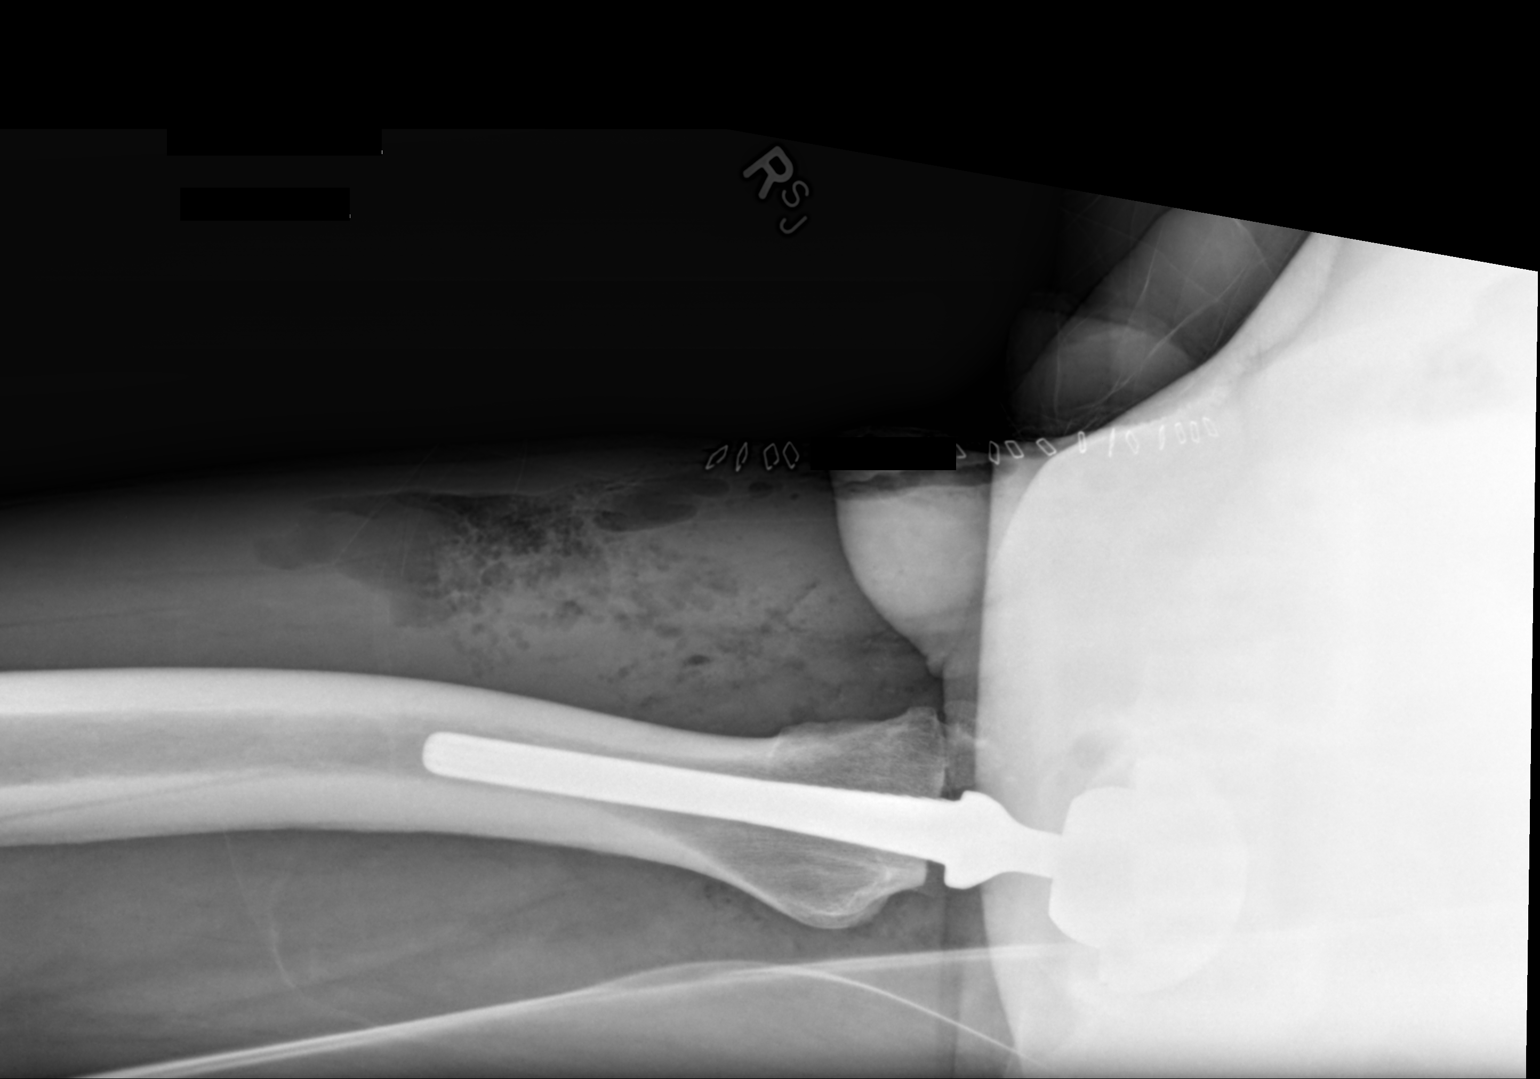

[2 of 2 positions shown; findings below may reference images not displayed]

FINDINGS: Right total hip arthroplasty is anatomically aligned.  No
breakage or loosening of the hardware.
IMPRESSION: Right total arthroplasty anatomically aligned.

## 2016-09-09 ENCOUNTER — Ambulatory Visit (INDEPENDENT_AMBULATORY_CARE_PROVIDER_SITE_OTHER): Payer: Medicaid Other | Admitting: Physician Assistant

## 2016-09-09 ENCOUNTER — Other Ambulatory Visit (INDEPENDENT_AMBULATORY_CARE_PROVIDER_SITE_OTHER): Payer: Self-pay | Admitting: Physician Assistant

## 2016-09-09 ENCOUNTER — Encounter (INDEPENDENT_AMBULATORY_CARE_PROVIDER_SITE_OTHER): Payer: Self-pay | Admitting: Physician Assistant

## 2016-09-09 VITALS — BP 169/98 | HR 72 | Temp 98.1°F | Ht 72.21 in | Wt 185.6 lb

## 2016-09-09 DIAGNOSIS — M7121 Synovial cyst of popliteal space [Baker], right knee: Secondary | ICD-10-CM

## 2016-09-09 DIAGNOSIS — R030 Elevated blood-pressure reading, without diagnosis of hypertension: Secondary | ICD-10-CM | POA: Diagnosis not present

## 2016-09-09 DIAGNOSIS — M25551 Pain in right hip: Secondary | ICD-10-CM | POA: Diagnosis not present

## 2016-09-09 DIAGNOSIS — M6740 Ganglion, unspecified site: Secondary | ICD-10-CM | POA: Diagnosis not present

## 2016-09-09 MED ORDER — ACETAMINOPHEN-CODEINE #3 300-30 MG PO TABS: 1.0000 | ORAL_TABLET | Freq: Three times a day (TID) | ORAL | 0 refills | Status: AC | PRN

## 2016-09-09 MED ORDER — IBUPROFEN 600 MG PO TABS: 600.0000 mg | ORAL_TABLET | Freq: Three times a day (TID) | ORAL | 0 refills | Status: DC | PRN

## 2016-09-09 NOTE — Progress Notes (Addendum)
Subjective:  Patient ID: Keith Mejia, male    DOB: [birth year]  Age: 56 y.o. MRN: [id number]  CC: right hip pain  HPI Keith Mejia is a 56 y.o. male s/p total hip arthroplasty right hip for severe osteoarthritis 2012 presents with right hip pain. Pain since last year but recent slip caused more pain. Saw Dr. Ninfa Linden, orthopedic surgeon at [facility] orthopedics last year, XR normal. 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 Was placed on anti-inflammatory meds. Patient is also concerned there may be metallosis. Has seen lawsuits for the type of hip replacement he received. Reports asking Dr. Ninfa Linden about metallosis but was reassured that he is fine. Patient has since become worried that he is sick because of a lump in the right wrist and right popliteal fossa. The right wrist lump is non tender and does not limited right hand function. The right popliteal lump is tender and causes pain at night. States his leg trembles at times when holding his leg in full extension.  Patient does not endorse any other symptoms.   Review of Systems  Constitutional: Negative for chills, fever and malaise/fatigue.  Eyes: Negative for blurred vision.  Respiratory: Negative for shortness of breath.   Cardiovascular: Negative for chest pain and palpitations.  Gastrointestinal: Negative for abdominal pain and nausea.  Genitourinary: Negative for dysuria and hematuria.  Musculoskeletal: Positive for joint pain. Negative for myalgias.  Skin: Negative for rash.  Neurological: Negative for tingling and headaches.  Psychiatric/Behavioral: Negative for depression. The patient is not nervous/anxious.     Objective:  BP (!) 169/98 (BP Location: Left Arm, Patient Position: Sitting, Cuff Size: Normal)   Pulse 72   Temp 98.1 F (36.7 C) (Oral)   Ht 6' 0.2" (1.834 m)   Wt 185 lb 9.6 oz (84.2 kg)   SpO2 98%   BMI 25.03 kg/m   BP/Weight 09/09/2016 06/07/2011 87/86/7672  Systolic BP 094 709 -   Diastolic BP 98 84 -  Wt. (Lbs) 185.6 - 189  BMI 25.03 - 25.63      Physical Exam  Constitutional: He is oriented to person, place, and time.  Well developed, well nourished, NAD, polite  HENT:  Head: Normocephalic and atraumatic.  Eyes: No scleral icterus.  Cardiovascular: Normal rate, regular rhythm and normal heart sounds.   Pulmonary/Chest: Effort normal and breath sounds normal.  Musculoskeletal: He exhibits no edema.  Mild to moderate limp favoring the right side. Small, approximately 1 cm, freely moving, and semi fluctuant nodule on the volar aspect of the right wrist. Right popliteal firm mass in the medial aspect with no erythema, edema, or ecchymosis.  Neurological: He is alert and oriented to person, place, and time.  RLE with 5/5 strength throughout.   Skin: Skin is warm and dry. No rash noted. No erythema. No pallor.  Incisional scar over right hip with no signs of erythema, edema, or ecchymosis.  Psychiatric: He has a normal mood and affect. His behavior is normal. Thought content normal.  Vitals reviewed.    Assessment & Plan:   1. Right hip pain - DG Arthro Hip Right; Future - ibuprofen (ADVIL,MOTRIN) 600 MG tablet; Take 1 tablet (600 mg total) by mouth every 8 (eight) hours as needed.  Dispense: 30 tablet; Refill: 0 - acetaminophen-codeine (TYLENOL #3) 300-30 MG tablet; Take 1 tablet by mouth every 8 (eight) hours as needed for moderate pain.  Dispense: 21 tablet; Refill: 0 - Chromium and Cobalt, WB (  [facility]) - Comprehensive metabolic panel  2. Ganglion cyst - Ambulatory referral to General Surgery - Comprehensive metabolic panel  3. Synovial cyst of right popliteal space - [place] LIMITED JOINT SPACE STRUCTURES LOW RIGHT; Future - Comprehensive metabolic panel  4. Elevated BP without diagnosis of hypertension    Meds ordered this encounter  Medications  . ibuprofen (ADVIL,MOTRIN) 600 MG tablet    Sig: Take 1 tablet (600 mg total) by mouth every 8 (eight)  hours as needed.    Dispense:  30 tablet    Refill:  0    Order Specific Question:   Supervising Provider    Answer:   Tresa Garter [id number]  . acetaminophen-codeine (TYLENOL #3) 300-30 MG tablet    Sig: Take 1 tablet by mouth every 8 (eight) hours as needed for moderate pain.    Dispense:  21 tablet    Refill:  0    Order Specific Question:   Supervising Provider    Answer:   Tresa Garter [id number]    Follow-up: Return in about 4 weeks (around 10/07/2016) for f/u right hip pain.   Clent Demark PA

## 2016-09-09 NOTE — Progress Notes (Signed)
New patient presents for a physical  Has concerns about a knot on his right wrist and leg  Pt complains of hip pain= 7

## 2016-09-09 NOTE — Patient Instructions (Signed)
Please reach out to Dr. Ninfa Linden to be seen for your right hip pain. We can fax x ray results to his office. You may also like to have [place] refer you to a [facility] orthopedic specialist.  Ganglion Cyst A ganglion cyst is a noncancerous, fluid-filled lump that occurs near joints or tendons. The ganglion cyst grows out of a joint or the lining of a tendon. It most often develops in the hand or wrist, but it can also develop in the shoulder, elbow, hip, knee, ankle, or foot. The round or oval ganglion cyst can be the size of a pea or larger than a grape. Increased activity may enlarge the size of the cyst because more fluid starts to build up. What are the causes? It is not known what causes a ganglion cyst to grow. However, it may be related to:  Inflammation or irritation around the joint.  An injury.  Repetitive movements or overuse.  Arthritis. What increases the risk? Risk factors include:  Being a woman.  Being age 37-50. What are the signs or symptoms? Symptoms may include:  A lump. This most often appears on the hand or wrist, but it can occur in other areas of the body.  Tingling.  Pain.  Numbness.  Muscle weakness.  Weak grip.  Less movement in a joint. How is this diagnosed? Ganglion cysts are most often diagnosed based on a physical exam. Your health care provider will feel the lump and may shine a light alongside it. If it is a ganglion cyst, a light often shines through it. Your health care provider may order an X-ray, ultrasound, or MRI to rule out other conditions. How is this treated? Ganglion cysts usually go away on their own without treatment. If pain or other symptoms are involved, treatment may be needed. Treatment is also needed if the ganglion cyst limits your movement or if it gets infected. Treatment may include:  Wearing a brace or splint on your wrist or finger.  Taking anti-inflammatory medicine.  Draining fluid from the lump with a needle  (aspiration).  Injecting a steroid into the joint.  Surgery to remove the ganglion cyst. Follow these instructions at home:  Do not press on the ganglion cyst, poke it with a needle, or hit it.  Take medicines only as directed by your health care provider.  Wear your brace or splint as directed by your health care provider.  Watch your ganglion cyst for any changes.  Keep all follow-up visits as directed by your health care provider. This is important. Contact a health care provider if:  Your ganglion cyst becomes larger or more painful.  You have increased redness, red streaks, or swelling.  You have pus coming from the lump.  You have weakness or numbness in the affected area.  You have a fever or chills. This information is not intended to replace advice given to you by your health care provider. Make sure you discuss any questions you have with your health care provider. Document Released: 05/21/2000 Document Revised: 10/30/2015 Document Reviewed: 11/06/2013 Elsevier Interactive Patient Education  2017 [employer].

## 2016-09-10 ENCOUNTER — Ambulatory Visit (INDEPENDENT_AMBULATORY_CARE_PROVIDER_SITE_OTHER): Payer: Medicaid Other | Admitting: Physician Assistant

## 2016-09-10 ENCOUNTER — Encounter (INDEPENDENT_AMBULATORY_CARE_PROVIDER_SITE_OTHER): Payer: Self-pay | Admitting: Physician Assistant

## 2016-09-10 VITALS — BP 131/77 | HR 62 | Temp 98.0°F | Ht 72.0 in | Wt 185.0 lb

## 2016-09-10 DIAGNOSIS — R030 Elevated blood-pressure reading, without diagnosis of hypertension: Secondary | ICD-10-CM | POA: Diagnosis not present

## 2016-09-10 NOTE — Progress Notes (Signed)
  Subjective:  Patient ID: Keith Mejia, male    DOB: Mar 31, 1961  Age: 56 y.o. MRN: [id number]  CC: f/u BP    HPI Keith Mejia is a 56 y.o. male with a PMH of total hip arthroplasty right hip for severe osteoarthritis 2012 presents for f/u of BP elevation seen yesterday here in clinic. He was 169/98 yesterday and is found to be at 131/77 today. He attributes the reduction in BP to better pain control and sleeping well last night. Denies CP, palpitations, SOB, HA, orthopnea, PND, presyncope, syncope, weakness, tingling, or numbness.     Review of Systems  Constitutional: Negative for chills, fever and malaise/fatigue.  Eyes: Negative for blurred vision.  Respiratory: Negative for shortness of breath.   Cardiovascular: Negative for chest pain and palpitations.  Gastrointestinal: Negative for abdominal pain and nausea.  Genitourinary: Negative for dysuria and hematuria.  Musculoskeletal: Positive for joint pain (right hip). Negative for myalgias.  Skin: Negative for rash.  Neurological: Negative for tingling and headaches.  Psychiatric/Behavioral: Negative for depression. The patient is not nervous/anxious.     Objective:  BP 131/77 (BP Location: Left Arm, Patient Position: Sitting, Cuff Size: Normal)   Pulse 62   Temp 98 F (36.7 C) (Oral)   Ht 6' (1.829 m)   Wt 185 lb (83.9 kg)   SpO2 94%   BMI 25.09 kg/m   BP/Weight 09/10/2016 09/09/2016 71/11/2692  Systolic BP 854 627 035  Diastolic BP 77 98 84  Wt. (Lbs) 185 185.6 -  BMI 25.09 25.03 -      Physical Exam  Constitutional: He is oriented to person, place, and time.  Well developed, well nourished, NAD, polite  HENT:  Head: Normocephalic and atraumatic.  Cardiovascular: Normal rate, regular rhythm and normal heart sounds.   Pulmonary/Chest: Effort normal and breath sounds normal.  Musculoskeletal: He exhibits no edema.  Neurological: He is alert and oriented to person, place, and time. No cranial nerve deficit.  Skin:  Skin is warm and dry. No rash noted. No erythema. No pallor.  Psychiatric: He has a normal mood and affect. His behavior is normal. Thought content normal.  Vitals reviewed.    Assessment & Plan:   1. Elevated blood-pressure reading without diagnosis of hypertension - BP is much better today. BP may be optimized with further pain control, better sleep hygiene, and low salt diet.    Follow-up: Return in about 4 weeks (around 10/08/2016) for full physical.   Clent Demark PA

## 2016-09-10 NOTE — Patient Instructions (Signed)
DASH Eating Plan DASH stands for "Dietary Approaches to Stop Hypertension." The DASH eating plan is a healthy eating plan that has been shown to reduce high blood pressure (hypertension). It may also reduce your risk for type 2 diabetes, heart disease, and stroke. The DASH eating plan may also help with weight loss. What are tips for following this plan? General guidelines  Avoid eating more than 2,300 mg (milligrams) of salt (sodium) a day. If you have hypertension, you may need to reduce your sodium intake to 1,500 mg a day.  Limit alcohol intake to no more than 1 drink a day for nonpregnant women and 2 drinks a day for men. One drink equals 12 oz of beer, 5 oz of wine, or 1 oz of hard liquor.  Work with your health care provider to maintain a healthy body weight or to lose weight. Ask what an ideal weight is for you.  Get at least 30 minutes of exercise that causes your heart to beat faster (aerobic exercise) most days of the week. Activities may include walking, swimming, or biking.  Work with your health care provider or diet and nutrition specialist (dietitian) to adjust your eating plan to your individual calorie needs. Reading food labels  Check food labels for the amount of sodium per serving. Choose foods with less than 5 percent of the Daily Value of sodium. Generally, foods with less than 300 mg of sodium per serving fit into this eating plan.  To find whole grains, look for the word "whole" as the first word in the ingredient list. Shopping  Buy products labeled as "low-sodium" or "no salt added."  Buy fresh foods. Avoid canned foods and premade or frozen meals. Cooking  Avoid adding salt when cooking. Use salt-free seasonings or herbs instead of table salt or sea salt. Check with your health care provider or pharmacist before using salt substitutes.  Do not fry foods. Cook foods using healthy methods such as baking, boiling, grilling, and broiling instead.  Cook with  heart-healthy oils, such as olive, canola, soybean, or sunflower oil. Meal planning   Eat a balanced diet that includes: ? 5 or more servings of fruits and vegetables each day. At each meal, try to fill half of your plate with fruits and vegetables. ? Up to 6-8 servings of whole grains each day. ? Less than 6 oz of lean meat, poultry, or fish each day. A 3-oz serving of meat is about the same size as a deck of cards. One egg equals 1 oz. ? 2 servings of low-fat dairy each day. ? A serving of nuts, seeds, or beans 5 times each week. ? Heart-healthy fats. Healthy fats called Omega-3 fatty acids are found in foods such as flaxseeds and coldwater fish, like sardines, salmon, and mackerel.  Limit how much you eat of the following: ? Canned or prepackaged foods. ? Food that is high in trans fat, such as fried foods. ? Food that is high in saturated fat, such as fatty meat. ? Sweets, desserts, sugary drinks, and other foods with added sugar. ? Full-fat dairy products.  Do not salt foods before eating.  Try to eat at least 2 vegetarian meals each week.  Eat more home-cooked food and less restaurant, buffet, and fast food.  When eating at a restaurant, ask that your food be prepared with less salt or no salt, if possible. What foods are recommended? The items listed may not be a complete list. Talk with your dietitian about what   dietary choices are best for you. Grains Whole-grain or whole-wheat bread. Whole-grain or whole-wheat pasta. Brown rice. Oatmeal. Quinoa. Bulgur. Whole-grain and low-sodium cereals. Pita bread. Low-fat, low-sodium crackers. Whole-wheat flour tortillas. Vegetables Fresh or frozen vegetables (raw, steamed, roasted, or grilled). Low-sodium or reduced-sodium tomato and vegetable juice. Low-sodium or reduced-sodium tomato sauce and tomato paste. Low-sodium or reduced-sodium canned vegetables. Fruits All fresh, dried, or frozen fruit. Canned fruit in natural juice (without  added sugar). Meat and other protein foods Skinless chicken or turkey. Ground chicken or turkey. Pork with fat trimmed off. Fish and seafood. Egg whites. Dried beans, peas, or lentils. Unsalted nuts, nut butters, and seeds. Unsalted canned beans. Lean cuts of beef with fat trimmed off. Low-sodium, lean deli meat. Dairy Low-fat (1%) or fat-free (skim) milk. Fat-free, low-fat, or reduced-fat cheeses. Nonfat, low-sodium ricotta or cottage cheese. Low-fat or nonfat yogurt. Low-fat, low-sodium cheese. Fats and oils Soft margarine without trans fats. Vegetable oil. Low-fat, reduced-fat, or light mayonnaise and salad dressings (reduced-sodium). Canola, safflower, olive, soybean, and sunflower oils. Avocado. Seasoning and other foods Herbs. Spices. Seasoning mixes without salt. Unsalted popcorn and pretzels. Fat-free sweets. What foods are not recommended? The items listed may not be a complete list. Talk with your dietitian about what dietary choices are best for you. Grains Baked goods made with fat, such as croissants, muffins, or some breads. Dry pasta or rice meal packs. Vegetables Creamed or fried vegetables. Vegetables in a cheese sauce. Regular canned vegetables (not low-sodium or reduced-sodium). Regular canned tomato sauce and paste (not low-sodium or reduced-sodium). Regular tomato and vegetable juice (not low-sodium or reduced-sodium). Pickles. Olives. Fruits Canned fruit in a light or heavy syrup. Fried fruit. Fruit in cream or butter sauce. Meat and other protein foods Fatty cuts of meat. Ribs. Fried meat. Bacon. Sausage. Bologna and other processed lunch meats. Salami. Fatback. Hotdogs. Bratwurst. Salted nuts and seeds. Canned beans with added salt. Canned or smoked fish. Whole eggs or egg yolks. Chicken or turkey with skin. Dairy Whole or 2% milk, cream, and half-and-half. Whole or full-fat cream cheese. Whole-fat or sweetened yogurt. Full-fat cheese. Nondairy creamers. Whipped toppings.  Processed cheese and cheese spreads. Fats and oils Butter. Stick margarine. Lard. Shortening. Ghee. Bacon fat. Tropical oils, such as coconut, palm kernel, or palm oil. Seasoning and other foods Salted popcorn and pretzels. Onion salt, garlic salt, seasoned salt, table salt, and sea salt. Worcestershire sauce. Tartar sauce. Barbecue sauce. Teriyaki sauce. Soy sauce, including reduced-sodium. Steak sauce. Canned and packaged gravies. Fish sauce. Oyster sauce. Cocktail sauce. Horseradish that you find on the shelf. Ketchup. Mustard. Meat flavorings and tenderizers. Bouillon cubes. Hot sauce and Tabasco sauce. Premade or packaged marinades. Premade or packaged taco seasonings. Relishes. Regular salad dressings. Where to find more information:  National Heart, Lung, and Blood Institute: www.nhlbi.nih.gov  American Heart Association: www.heart.org Summary  The DASH eating plan is a healthy eating plan that has been shown to reduce high blood pressure (hypertension). It may also reduce your risk for type 2 diabetes, heart disease, and stroke.  With the DASH eating plan, you should limit salt (sodium) intake to 2,300 mg a day. If you have hypertension, you may need to reduce your sodium intake to 1,500 mg a day.  When on the DASH eating plan, aim to eat more fresh fruits and vegetables, whole grains, lean proteins, low-fat dairy, and heart-healthy fats.  Work with your health care provider or diet and nutrition specialist (dietitian) to adjust your eating plan to your individual   calorie needs. This information is not intended to replace advice given to you by your health care provider. Make sure you discuss any questions you have with your health care provider. Document Released: 05/13/2011 Document Revised: 05/17/2016 Document Reviewed: 05/17/2016 Elsevier Interactive Patient Education  2017 [employer] Inc.  

## 2016-09-10 NOTE — Progress Notes (Signed)
Pt presents today for a blood pressure check Pt denies any pain today

## 2016-09-13 ENCOUNTER — Telehealth (INDEPENDENT_AMBULATORY_CARE_PROVIDER_SITE_OTHER): Payer: Self-pay | Admitting: Physician Assistant

## 2016-09-13 NOTE — Telephone Encounter (Signed)
Keith Mejia with [facility] Pre-Cert dept called stated patient has medicaid needs pre-cert for Ultrasound,  Please call her back at [phone]

## 2016-09-14 ENCOUNTER — Ambulatory Visit (HOSPITAL_COMMUNITY): Payer: Medicaid Other

## 2016-09-14 LAB — COMPREHENSIVE METABOLIC PANEL
ALBUMIN: 4.2 g/dL (ref 3.5–5.5)
ALK PHOS: 128 IU/L — AB (ref 39–117)
ALT: 6 IU/L (ref 0–44)
AST: 26 IU/L (ref 0–40)
Albumin/Globulin Ratio: 1.3 (ref 1.2–2.2)
BUN / CREAT RATIO: 11 (ref 9–20)
BUN: 12 mg/dL (ref 6–24)
Bilirubin Total: 0.5 mg/dL (ref 0.0–1.2)
CALCIUM: 9.6 mg/dL (ref 8.7–10.2)
CO2: 22 mmol/L (ref 18–29)
CREATININE: 1.11 mg/dL (ref 0.76–1.27)
Chloride: 107 mmol/L — ABNORMAL HIGH (ref 96–106)
GFR calc Af Amer: 86 mL/min/{1.73_m2} (ref >59)
GFR, EST NON AFRICAN AMERICAN: 74 mL/min/{1.73_m2} (ref >59)
Globulin, Total: 3.2 g/dL (ref 1.5–4.5)
Glucose: 99 mg/dL (ref 65–99)
Potassium: 4.7 mmol/L (ref 3.5–5.2)
SODIUM: 146 mmol/L — AB (ref 134–144)
TOTAL PROTEIN: 7.4 g/dL (ref 6.0–8.5)

## 2016-09-14 LAB — CHROMIUM AND COBALT, WB (MOM): Cobalt: <1 ng/mL (ref <3.0)

## 2016-09-14 NOTE — Telephone Encounter (Signed)
Spoke with Keith Mejia and informed her that as of right now medicaid has denied coverage for the [place]. Nat Christen, CMA

## 2016-09-16 ENCOUNTER — Telehealth: Payer: Self-pay | Admitting: Physician Assistant

## 2016-09-16 ENCOUNTER — Ambulatory Visit (INDEPENDENT_AMBULATORY_CARE_PROVIDER_SITE_OTHER): Payer: Self-pay | Admitting: Orthopaedic Surgery

## 2016-09-17 ENCOUNTER — Ambulatory Visit (HOSPITAL_COMMUNITY): Payer: Medicaid Other

## 2016-09-28 ENCOUNTER — Ambulatory Visit (INDEPENDENT_AMBULATORY_CARE_PROVIDER_SITE_OTHER): Payer: Self-pay | Admitting: Orthopaedic Surgery

## 2018-08-17 ENCOUNTER — Ambulatory Visit (INDEPENDENT_AMBULATORY_CARE_PROVIDER_SITE_OTHER): Payer: Medicare Other | Admitting: Primary Care

## 2018-08-17 ENCOUNTER — Encounter (INDEPENDENT_AMBULATORY_CARE_PROVIDER_SITE_OTHER): Payer: Self-pay | Admitting: Primary Care

## 2018-08-17 ENCOUNTER — Other Ambulatory Visit: Payer: Self-pay

## 2018-08-17 VITALS — BP 152/89 | HR 80 | Temp 97.8°F | Ht 72.0 in | Wt 180.8 lb

## 2018-08-17 DIAGNOSIS — Z1211 Encounter for screening for malignant neoplasm of colon: Secondary | ICD-10-CM

## 2018-08-17 DIAGNOSIS — Z0000 Encounter for general adult medical examination without abnormal findings: Secondary | ICD-10-CM

## 2018-08-17 DIAGNOSIS — I10 Essential (primary) hypertension: Secondary | ICD-10-CM | POA: Diagnosis not present

## 2018-08-17 DIAGNOSIS — Z23 Encounter for immunization: Secondary | ICD-10-CM

## 2018-08-17 DIAGNOSIS — C61 Malignant neoplasm of prostate: Secondary | ICD-10-CM

## 2018-08-17 MED ORDER — HYDROCHLOROTHIAZIDE 12.5 MG PO TABS: 25.0000 mg | ORAL_TABLET | Freq: Every day | ORAL | 3 refills | Status: DC

## 2018-08-17 MED ORDER — AMLODIPINE BESYLATE 10 MG PO TABS
10.0000 mg | ORAL_TABLET | Freq: Every day | ORAL | 3 refills | Status: DC
Start: 2018-08-17 — End: 2019-02-26

## 2018-08-17 NOTE — Progress Notes (Signed)
Established Patient Office Visit  Subjective:  Patient ID: Keith Mejia, male    DOB: 06/22/1960  Age: 58 y.o. MRN: [id number]  CC:  Chief Complaint  Patient presents with  . Annual Exam    hypertension     HP Keith Mejia presents for re-estabilshing care. He has not re turn because he felt his needs were not met. Bp is elevated not on any medications. PMH HTN and RHR. Last visit to office was 4/18.  Past Medical History:  Diagnosis Date  . Arthritis 06-03-11   osteoarthritis-rt. hip, lt shoulder  . Bronchitis 06-03-11   x1 -a few yrs ago, none recent  . GERD (gastroesophageal reflux disease) 06-03-11   as needed Prilosec OTC  . Headache(784.0) 06-03-11   sinus related, none recent    Past Surgical History:  Procedure Laterality Date  . TOTAL HIP ARTHROPLASTY  06/04/2011   Procedure: TOTAL HIP ARTHROPLASTY ANTERIOR APPROACH;  Surgeon: Mcarthur Rossetti;  Location: [facility];  Service: Orthopedics;  Laterality: Right;  Right Total Hip Arthroplasty, Direct Anterior Approach    (c-arm)    Family History  Problem Relation Age of Onset  . Diabetes Mother   . Cancer Father     Social History   Socioeconomic History  . Marital status: Married    Spouse name: Not on file  . Number of children: Not on file  . Years of education: Not on file  . Highest education level: Not on file  Occupational History  . Not on file  Social Needs  . Financial resource strain: Not on file  . Food insecurity:    Worry: Not on file    Inability: Not on file  . Transportation needs:    Medical: Not on file    Non-medical: Not on file  Tobacco Use  . Smoking status: Current Every Day Smoker    Packs/day: 0.50    Years: 35.00    Pack years: 17.50    Types: Cigarettes  . Smokeless tobacco: Never Used  Substance and Sexual Activity  . Alcohol use: Yes    Comment: occasional  . Drug use: Yes    Types: Marijuana  . Sexual activity: Yes  Lifestyle  . Physical activity:   Days per week: Not on file    Minutes per session: Not on file  . Stress: Not on file  Relationships  . Social connections:    Talks on phone: Not on file    Gets together: Not on file    Attends religious service: Not on file    Active member of club or organization: Not on file    Attends meetings of clubs or organizations: Not on file    Relationship status: Not on file  . Intimate partner violence:    Fear of current or ex partner: Not on file    Emotionally abused: Not on file    Physically abused: Not on file    Forced sexual activity: Not on file  Other Topics Concern  . Not on file  Social History Narrative  . Not on file    Outpatient Medications Prior to Visit  Medication Sig Dispense Refill  . ibuprofen (ADVIL,MOTRIN) 600 MG tablet Take 1 tablet (600 mg total) by mouth every 8 (eight) hours as needed. 30 tablet 0  . methocarbamol (ROBAXIN) 500 MG tablet Take 500 mg by mouth 2 (two) times daily as needed. For pain/spasms      No facility-administered medications prior to visit.  No Known Allergies  ROS Review of Systems  Constitutional: Negative.   HENT: Negative.   Eyes: Positive for visual disturbance.  Respiratory: Negative.   Cardiovascular: Negative.   Gastrointestinal: Negative.   Endocrine: Negative.   Genitourinary: Negative.   Musculoskeletal: Negative.       Objective:    Physical Exam  Constitutional: He is oriented to person, place, and time. He appears well-developed and well-nourished.  HENT:  Head: Normocephalic.  Eyes: EOM are normal.  Neck: Normal range of motion.  Cardiovascular: Normal rate.  Pulmonary/Chest: Effort normal and breath sounds normal.  Abdominal: Soft. Bowel sounds are normal.  Musculoskeletal: Normal range of motion.  Neurological: He is alert and oriented to person, place, and time.  Skin: Skin is dry.  Psychiatric: He has a normal mood and affect.    BP (!) 152/89 (BP Location: Right Arm, Patient Position:  Sitting, Cuff Size: Normal)   Pulse 80   Temp 97.8 F (36.6 C) (Oral)   Ht 6' (1.829 m)   Wt 180 lb 12.8 oz (82 kg)   SpO2 96%   BMI 24.52 kg/m  Wt Readings from Last 3 Encounters:  08/17/18 180 lb 12.8 oz (82 kg)  09/10/16 185 lb (83.9 kg)  09/09/16 185 lb 9.6 oz (84.2 kg)     Health Maintenance Due  Topic Date Due  . Hepatitis C Screening  1961-03-21  . HIV Screening  02/19/1976  . COLONOSCOPY  02/19/2011    There are no preventive care reminders to display for this patient.  No results found for: TSH Lab Results  Component Value Date   WBC 8.1 06/07/2011   HGB 10.7 (L) 06/07/2011   HCT 32.0 (L) 06/07/2011   MCV 86.5 06/07/2011   PLT 216 06/07/2011   Lab Results  Component Value Date   NA 146 (H) 09/09/2016   K 4.7 09/09/2016   CO2 22 09/09/2016   GLUCOSE 99 09/09/2016   BUN 12 09/09/2016   CREATININE 1.11 09/09/2016   BILITOT 0.5 09/09/2016   ALKPHOS 128 (H) 09/09/2016   AST 26 09/09/2016   ALT 6 09/09/2016   PROT 7.4 09/09/2016   ALBUMIN 4.2 09/09/2016   CALCIUM 9.6 09/09/2016   No results found for: CHOL No results found for: HDL No results found for: LDLCALC No results found for: TRIG No results found for: CHOLHDL No results found for: HGBA1C    Assessment & Plan:   Problem List Items Addressed This Visit    None    Visit Diagnoses    Need for prophylactic vaccination and inoculation against influenza    -  Primary   Relevant Orders   Flu Vaccine QUAD 6+ mos PF IM (Fluarix Quad PF) (Completed)   Need for Tdap vaccination       Relevant Orders   Tdap vaccine greater than or equal to 7yo IM (Completed)   Special screening for malignant neoplasms, colon       Relevant Orders   Fecal occult blood, imunochemical([facility])   Essential hypertension       Relevant Medications   hydrochlorothiazide (HYDRODIURIL) 12.5 MG tablet   amLODipine (NORVASC) 10 MG tablet   Other Relevant Orders   CBC with Differential   Comprehensive  metabolic panel   Lipid Panel   Malignant neoplasm of prostate ([place])       Relevant Orders   PSA   Healthcare maintenance          Meds ordered this encounter  Medications  .  hydrochlorothiazide (HYDRODIURIL) 12.5 MG tablet    Sig: Take 2 tablets (25 mg total) by mouth daily for 30 days.    Dispense:  30 tablet    Refill:  3  . amLODipine (NORVASC) 10 MG tablet    Sig: Take 1 tablet (10 mg total) by mouth daily.    Dispense:  90 tablet    Refill:  3    Follow-up: Return in about 2 weeks (around 08/31/2018) for Bp check .    Kerin Perna, NP

## 2018-08-17 NOTE — Patient Instructions (Signed)

## 2018-08-18 ENCOUNTER — Other Ambulatory Visit (INDEPENDENT_AMBULATORY_CARE_PROVIDER_SITE_OTHER): Payer: Self-pay

## 2018-08-18 ENCOUNTER — Other Ambulatory Visit (INDEPENDENT_AMBULATORY_CARE_PROVIDER_SITE_OTHER): Payer: Medicare Other

## 2018-08-18 DIAGNOSIS — Z1211 Encounter for screening for malignant neoplasm of colon: Secondary | ICD-10-CM

## 2018-08-18 DIAGNOSIS — I10 Essential (primary) hypertension: Secondary | ICD-10-CM

## 2018-08-18 DIAGNOSIS — C61 Malignant neoplasm of prostate: Secondary | ICD-10-CM

## 2018-08-19 LAB — CBC WITH DIFFERENTIAL/PLATELET
Basophils Absolute: 0.1 10*3/uL (ref 0.0–0.2)
Basos: 1 %
EOS (ABSOLUTE): 0.1 10*3/uL (ref 0.0–0.4)
Eos: 1 %
Hematocrit: 40.9 % (ref 37.5–51.0)
Hemoglobin: 13.8 g/dL (ref 13.0–17.7)
IMMATURE GRANS (ABS): 0 10*3/uL (ref 0.0–0.1)
Immature Granulocytes: 0 %
LYMPHS: 41 %
Lymphocytes Absolute: 2.7 10*3/uL (ref 0.7–3.1)
MCH: 30.1 pg (ref 26.6–33.0)
MCHC: 33.7 g/dL (ref 31.5–35.7)
MCV: 89 fL (ref 79–97)
Monocytes Absolute: 0.4 10*3/uL (ref 0.1–0.9)
Monocytes: 7 %
NEUTROS PCT: 50 %
Neutrophils Absolute: 3.4 10*3/uL (ref 1.4–7.0)
Platelets: 305 10*3/uL (ref 150–450)
RBC: 4.59 x10E6/uL (ref 4.14–5.80)
RDW: 13.2 % (ref 11.6–15.4)
WBC: 6.6 10*3/uL (ref 3.4–10.8)

## 2018-08-19 LAB — LIPID PANEL
Chol/HDL Ratio: 5.4 ratio — ABNORMAL HIGH (ref 0.0–5.0)
Cholesterol, Total: 206 mg/dL — ABNORMAL HIGH (ref 100–199)
HDL: 38 mg/dL — ABNORMAL LOW (ref >39)
LDL Calculated: 148 mg/dL — ABNORMAL HIGH (ref 0–99)
Triglycerides: 100 mg/dL (ref 0–149)
VLDL CHOLESTEROL CAL: 20 mg/dL (ref 5–40)

## 2018-08-19 LAB — COMPREHENSIVE METABOLIC PANEL
ALK PHOS: 116 IU/L (ref 39–117)
ALT: 4 IU/L (ref 0–44)
AST: 20 IU/L (ref 0–40)
Albumin/Globulin Ratio: 1.5 (ref 1.2–2.2)
Albumin: 4.4 g/dL (ref 3.8–4.9)
BUN/Creatinine Ratio: 9 (ref 9–20)
BUN: 11 mg/dL (ref 6–24)
Bilirubin Total: 0.4 mg/dL (ref 0.0–1.2)
CO2: 25 mmol/L (ref 20–29)
Calcium: 9.7 mg/dL (ref 8.7–10.2)
Chloride: 101 mmol/L (ref 96–106)
Creatinine, Ser: 1.22 mg/dL (ref 0.76–1.27)
GFR calc Af Amer: 76 mL/min/{1.73_m2} (ref >59)
GFR calc non Af Amer: 65 mL/min/{1.73_m2} (ref >59)
Globulin, Total: 2.9 g/dL (ref 1.5–4.5)
Glucose: 95 mg/dL (ref 65–99)
Potassium: 4.2 mmol/L (ref 3.5–5.2)
Sodium: 142 mmol/L (ref 134–144)
Total Protein: 7.3 g/dL (ref 6.0–8.5)

## 2018-08-19 LAB — PSA: Prostate Specific Ag, Serum: 1.1 ng/mL (ref 0.0–4.0)

## 2018-08-20 LAB — FECAL OCCULT BLOOD, IMMUNOCHEMICAL: Fecal Occult Bld: NEGATIVE

## 2018-08-25 ENCOUNTER — Telehealth (INDEPENDENT_AMBULATORY_CARE_PROVIDER_SITE_OTHER): Payer: Self-pay

## 2018-08-25 NOTE — Telephone Encounter (Signed)
Patient returned call and is aware that FIT is negative. Nat Christen, CMA

## 2018-08-25 NOTE — Telephone Encounter (Signed)
-----   Message from Kerin Perna, NP sent at 08/21/2018 10:19 AM EDT ----- Let patient knotest negativew

## 2018-08-31 ENCOUNTER — Ambulatory Visit (INDEPENDENT_AMBULATORY_CARE_PROVIDER_SITE_OTHER): Payer: Medicare Other

## 2018-11-17 ENCOUNTER — Ambulatory Visit (INDEPENDENT_AMBULATORY_CARE_PROVIDER_SITE_OTHER): Payer: Medicare Other | Admitting: Primary Care

## 2019-02-26 ENCOUNTER — Ambulatory Visit (INDEPENDENT_AMBULATORY_CARE_PROVIDER_SITE_OTHER): Payer: Medicare HMO | Admitting: Primary Care

## 2019-02-26 ENCOUNTER — Encounter (INDEPENDENT_AMBULATORY_CARE_PROVIDER_SITE_OTHER): Payer: Self-pay | Admitting: Primary Care

## 2019-02-26 ENCOUNTER — Other Ambulatory Visit: Payer: Self-pay

## 2019-02-26 VITALS — BP 154/88 | HR 63 | Temp 97.8°F | Ht 72.0 in | Wt 184.4 lb

## 2019-02-26 DIAGNOSIS — Z1211 Encounter for screening for malignant neoplasm of colon: Secondary | ICD-10-CM

## 2019-02-26 DIAGNOSIS — Z114 Encounter for screening for human immunodeficiency virus [HIV]: Secondary | ICD-10-CM | POA: Diagnosis not present

## 2019-02-26 DIAGNOSIS — M79671 Pain in right foot: Secondary | ICD-10-CM | POA: Diagnosis not present

## 2019-02-26 DIAGNOSIS — Z0000 Encounter for general adult medical examination without abnormal findings: Secondary | ICD-10-CM

## 2019-02-26 DIAGNOSIS — Z23 Encounter for immunization: Secondary | ICD-10-CM | POA: Diagnosis not present

## 2019-02-26 DIAGNOSIS — Z1159 Encounter for screening for other viral diseases: Secondary | ICD-10-CM

## 2019-02-26 DIAGNOSIS — M79672 Pain in left foot: Secondary | ICD-10-CM | POA: Diagnosis not present

## 2019-02-26 DIAGNOSIS — I10 Essential (primary) hypertension: Secondary | ICD-10-CM | POA: Diagnosis not present

## 2019-02-26 DIAGNOSIS — Z760 Encounter for issue of repeat prescription: Secondary | ICD-10-CM

## 2019-02-26 MED ORDER — LOSARTAN POTASSIUM-HCTZ 100-25 MG PO TABS: 1.0000 | ORAL_TABLET | Freq: Every day | ORAL | 3 refills | Status: DC

## 2019-02-26 NOTE — Patient Instructions (Signed)

## 2019-02-26 NOTE — Progress Notes (Signed)
Established Patient Office Visit  Subjective:  Patient ID: Keith Mejia, male    DOB: April 22, 1961  Age: 58 y.o. MRN: [id number]  CC:  Chief Complaint  Patient presents with  . Follow-up    HTN    HPI Keith Mejia presents for management of hypertension his Bp is elevated 158/88 he denies shortness of breath, headaches, chest pain or lower extremity edema.  He also, is concerns about bilatera foot bone spur lateral.  Past Medical History:  Diagnosis Date  . Arthritis 06-03-11   osteoarthritis-rt. hip, lt shoulder  . Bronchitis 06-03-11   x1 -a few yrs ago, none recent  . GERD (gastroesophageal reflux disease) 06-03-11   as needed Prilosec OTC  . Headache(784.0) 06-03-11   sinus related, none recent    Past Surgical History:  Procedure Laterality Date  . TOTAL HIP ARTHROPLASTY  06/04/2011   Procedure: TOTAL HIP ARTHROPLASTY ANTERIOR APPROACH;  Surgeon: Keith Mejia;  Location: [facility];  Service: Orthopedics;  Laterality: Right;  Right Total Hip Arthroplasty, Direct Anterior Approach    (c-arm)    Family History  Problem Relation Age of Onset  . Diabetes Mother   . Cancer Father     Social History   Socioeconomic History  . Marital status: Married    Spouse name: Not on file  . Number of children: Not on file  . Years of education: Not on file  . Highest education level: Not on file  Occupational History  . Not on file  Social Needs  . Financial resource strain: Not on file  . Food insecurity    Worry: Not on file    Inability: Not on file  . Transportation needs    Medical: Not on file    Non-medical: Not on file  Tobacco Use  . Smoking status: Current Every Day Smoker    Packs/day: 0.50    Years: 35.00    Pack years: 17.50    Types: Cigarettes  . Smokeless tobacco: Never Used  Substance and Sexual Activity  . Alcohol use: Yes    Comment: occasional  . Drug use: Yes    Types: Marijuana  . Sexual activity: Yes  Lifestyle  . Physical  activity    Days per week: Not on file    Minutes per session: Not on file  . Stress: Not on file  Relationships  . Social Herbalist on phone: Not on file    Gets together: Not on file    Attends religious service: Not on file    Active member of club or organization: Not on file    Attends meetings of clubs or organizations: Not on file    Relationship status: Not on file  . Intimate partner violence    Fear of current or ex partner: Not on file    Emotionally abused: Not on file    Physically abused: Not on file    Forced sexual activity: Not on file  Other Topics Concern  . Not on file  Social History Narrative  . Not on file    Outpatient Medications Prior to Visit  Medication Sig Dispense Refill  . hydrochlorothiazide (HYDRODIURIL) 12.5 MG tablet Take 2 tablets (25 mg total) by mouth daily for 30 days. 30 tablet 3  . amLODipine (NORVASC) 10 MG tablet Take 1 tablet (10 mg total) by mouth daily. 90 tablet 3   No facility-administered medications prior to visit.     Not on File  ROS Review of Systems  Musculoskeletal:       Bilateral foot pain increased with walking- shoes are also rubbing on both sides causing more irratation  All other systems reviewed and are negative.     Objective:    Physical Exam  Constitutional: He is oriented to person, place, and time. He appears well-developed and well-nourished.  HENT:  Head: Normocephalic and atraumatic.  Eyes: Pupils are equal, round, and reactive to light. EOM are normal.  Neck: Normal range of motion. Neck supple.  Cardiovascular: Normal rate and regular rhythm.  Pulmonary/Chest: Effort normal.  Abdominal: Soft. Bowel sounds are normal. He exhibits distension.  Musculoskeletal: Normal range of motion.  Neurological: He is alert and oriented to person, place, and time.  Skin: Skin is warm and dry.  Psychiatric: He has a normal mood and affect.    BP (!) 154/88 (BP Location: Right Arm, Patient  Position: Sitting, Cuff Size: Normal)   Pulse 63   Temp 97.8 F (36.6 C) (Tympanic)   Ht 6' (1.829 m)   Wt 184 lb 6.4 oz (83.6 kg)   SpO2 97%   BMI 25.01 kg/m  Wt Readings from Last 3 Encounters:  02/26/19 184 lb 6.4 oz (83.6 kg)  08/17/18 180 lb 12.8 oz (82 kg)  09/10/16 185 lb (83.9 kg)     Health Maintenance Due  Topic Date Due  . COLONOSCOPY  02/19/2011    There are no preventive care reminders to display for this patient.  No results found for: TSH Lab Results  Component Value Date   WBC 5.1 02/26/2019   HGB 13.6 02/26/2019   HCT 42.0 02/26/2019   MCV 89 02/26/2019   PLT 353 02/26/2019   Lab Results  Component Value Date   NA 144 02/26/2019   K 4.1 02/26/2019   CO2 22 02/26/2019   GLUCOSE 98 02/26/2019   BUN 15 02/26/2019   CREATININE 1.23 02/26/2019   BILITOT 0.5 02/26/2019   ALKPHOS 132 (H) 02/26/2019   AST 23 02/26/2019   ALT 5 02/26/2019   PROT 7.0 02/26/2019   ALBUMIN 4.2 02/26/2019   CALCIUM 9.4 02/26/2019   Lab Results  Component Value Date   CHOL 219 (H) 02/26/2019   Lab Results  Component Value Date   HDL 41 02/26/2019   Lab Results  Component Value Date   LDLCALC 148 (H) 08/18/2018   Lab Results  Component Value Date   TRIG 86 02/26/2019   Lab Results  Component Value Date   CHOLHDL 5.3 (H) 02/26/2019   No results found for: HGBA1C    Assessment & Plan:  Keith Mejia was seen today for follow-up.  Diagnoses and all orders for this visit:  Essential hypertension Counseled on blood pressure goal of less than 130/80, low-sodium, DASH diet, medication compliance, 150 minutes of moderate intensity exercise per week. Discussed medication compliance, adverse effects. -     CBC with Differential -     Lipid Panel  Healthcare maintenance -     CBC with Differential -     CMP14+EGFR -     Lipid Panel  Colon cancer screening Normal colon cancer screening.  CDC recommends colorectal screening from ages 87-75. Screening can begin at  63 or earlier in some cases. This screening is used for a disease when no symptoms are present . Diagnostic test is used for symptoms examples blood in stool, colorectal polyps or coloector cancer, family history or inflammatory bowel disease - chron's or ulcerative colitis .(USPSTF) -  Fecal occult blood, imunochemical; Future  Screening for HIV (human immunodeficiency virus) -     HIV antibody (with reflex)  Need for hepatitis C screening test -     Hepatitis C Antibody  Need for immunization against influenza CDC recommends influenza vaccine yearly.  Flu intercanthus respiratory illness caused by influenza virus that affects the nose throat and sometimes the lungs.  Experts believe that liver spread managed by tract was made mainly by tiny droplets made when people with the flu cough sneeze or talk. -     Flu Vaccine QUAD 36+ mos IM  Other orders/Medication refill -     losartan-hydrochlorothiazide (HYZAAR) 100-25 MG tablet; Take 1 tablet by mouth daily.     Meds ordered this encounter  Medications  . losartan-hydrochlorothiazide (HYZAAR) 100-25 MG tablet    Sig: Take 1 tablet by mouth daily.    Dispense:  90 tablet    Refill:  3    Follow-up: Return in about 4 weeks (around 03/26/2019) for Bp medication change in person.    Kerin Perna, NP

## 2019-02-26 NOTE — Progress Notes (Signed)
Knot on right wrist Possible bone spur on right foot  Hip pain

## 2019-02-27 LAB — CMP14+EGFR
ALT: 5 IU/L (ref 0–44)
AST: 23 IU/L (ref 0–40)
Albumin/Globulin Ratio: 1.5 (ref 1.2–2.2)
Albumin: 4.2 g/dL (ref 3.8–4.9)
Alkaline Phosphatase: 132 IU/L — ABNORMAL HIGH (ref 39–117)
BUN/Creatinine Ratio: 12 (ref 9–20)
BUN: 15 mg/dL (ref 6–24)
Bilirubin Total: 0.5 mg/dL (ref 0.0–1.2)
CO2: 22 mmol/L (ref 20–29)
Calcium: 9.4 mg/dL (ref 8.7–10.2)
Chloride: 107 mmol/L — ABNORMAL HIGH (ref 96–106)
Creatinine, Ser: 1.23 mg/dL (ref 0.76–1.27)
GFR calc Af Amer: 74 mL/min/{1.73_m2} (ref >59)
GFR calc non Af Amer: 64 mL/min/{1.73_m2} (ref >59)
Globulin, Total: 2.8 g/dL (ref 1.5–4.5)
Glucose: 98 mg/dL (ref 65–99)
Potassium: 4.1 mmol/L (ref 3.5–5.2)
Sodium: 144 mmol/L (ref 134–144)
Total Protein: 7 g/dL (ref 6.0–8.5)

## 2019-02-27 LAB — LIPID PANEL
Chol/HDL Ratio: 5.3 ratio — ABNORMAL HIGH (ref 0.0–5.0)
Cholesterol, Total: 219 mg/dL — ABNORMAL HIGH (ref 100–199)
HDL: 41 mg/dL (ref >39)
LDL Chol Calc (NIH): 163 mg/dL — ABNORMAL HIGH (ref 0–99)
Triglycerides: 86 mg/dL (ref 0–149)
VLDL Cholesterol Cal: 15 mg/dL (ref 5–40)

## 2019-02-27 LAB — CBC WITH DIFFERENTIAL/PLATELET
Basophils Absolute: 0 10*3/uL (ref 0.0–0.2)
Basos: 1 %
EOS (ABSOLUTE): 0.1 10*3/uL (ref 0.0–0.4)
Eos: 1 %
Hematocrit: 42 % (ref 37.5–51.0)
Hemoglobin: 13.6 g/dL (ref 13.0–17.7)
Immature Grans (Abs): 0 10*3/uL (ref 0.0–0.1)
Immature Granulocytes: 0 %
Lymphocytes Absolute: 2.5 10*3/uL (ref 0.7–3.1)
Lymphs: 49 %
MCH: 28.9 pg (ref 26.6–33.0)
MCHC: 32.4 g/dL (ref 31.5–35.7)
MCV: 89 fL (ref 79–97)
Monocytes Absolute: 0.3 10*3/uL (ref 0.1–0.9)
Monocytes: 7 %
Neutrophils Absolute: 2.1 10*3/uL (ref 1.4–7.0)
Neutrophils: 42 %
Platelets: 353 10*3/uL (ref 150–450)
RBC: 4.71 x10E6/uL (ref 4.14–5.80)
RDW: 13.3 % (ref 11.6–15.4)
WBC: 5.1 10*3/uL (ref 3.4–10.8)

## 2019-02-27 LAB — HEPATITIS C ANTIBODY: Hep C Virus Ab: 0.1 s/co ratio (ref 0.0–0.9)

## 2019-02-27 LAB — HIV ANTIBODY (ROUTINE TESTING W REFLEX): HIV Screen 4th Generation wRfx: NONREACTIVE

## 2019-03-02 ENCOUNTER — Telehealth (INDEPENDENT_AMBULATORY_CARE_PROVIDER_SITE_OTHER): Payer: Self-pay

## 2019-03-02 ENCOUNTER — Other Ambulatory Visit (INDEPENDENT_AMBULATORY_CARE_PROVIDER_SITE_OTHER): Payer: Self-pay | Admitting: Primary Care

## 2019-03-02 MED ORDER — ATORVASTATIN CALCIUM 40 MG PO TABS: 40.0000 mg | ORAL_TABLET | Freq: Every day | ORAL | 3 refills | Status: DC

## 2019-03-02 NOTE — Telephone Encounter (Signed)
-----   Message from Kerin Perna, NP sent at 03/02/2019 12:28 PM EDT ----- I have reviewed all labs and they are normal/unremarkable. Except  LDL and Cholesterol  is not in range. Encourage to decrease your fatty foods, red meat, cheese, milk and increase fiber like whole grains and veggies. You can also add a fiber supplement like Metamucil or Benefiber. Sent in atorvastin 40mg  take at night

## 2019-03-02 NOTE — Telephone Encounter (Signed)
Patient verified date of birth. He is aware that labs are normal except elevated LDL and cholesterol. Encouraged patient to decrease his consumption of fatty food, red meat, cheese and milk; increase fiber with foods like whole grains and veggies. Advised patient that he could also purchase fiber supplement such as Benefiber or Metamucil. Patient aware that Atorvastatin 40 mg has been sent to the pharmacy for him to take nightly at bedtime to help lower cholesterol. During call patient stated that PCP was going to refer him for cyst on his wrist and for bone spurs in his feet. No referrals placed. Please place referrals if appropriate. Nat Christen, CMA

## 2019-03-03 NOTE — Telephone Encounter (Signed)
Advised patient to take 40 mg of activated statin nightly.  Refills done on visit.  Increase fatty foods such as fried foods cheese, eggs, bacon, avocados,

## 2019-03-05 ENCOUNTER — Other Ambulatory Visit (INDEPENDENT_AMBULATORY_CARE_PROVIDER_SITE_OTHER): Payer: Self-pay | Admitting: Primary Care

## 2019-03-05 DIAGNOSIS — M7580 Other shoulder lesions, unspecified shoulder: Secondary | ICD-10-CM

## 2019-03-05 NOTE — Telephone Encounter (Signed)
Please address patients concern about referrals for bine spurs and cyst on wrist. Tempestt Latrelle Dodrill, CMA

## 2019-03-08 ENCOUNTER — Encounter: Payer: Self-pay | Admitting: Physician Assistant

## 2019-03-08 ENCOUNTER — Ambulatory Visit (INDEPENDENT_AMBULATORY_CARE_PROVIDER_SITE_OTHER): Payer: Medicare HMO

## 2019-03-08 ENCOUNTER — Ambulatory Visit: Payer: Self-pay

## 2019-03-08 ENCOUNTER — Ambulatory Visit (INDEPENDENT_AMBULATORY_CARE_PROVIDER_SITE_OTHER): Payer: Medicare HMO | Admitting: Physician Assistant

## 2019-03-08 DIAGNOSIS — M79671 Pain in right foot: Secondary | ICD-10-CM | POA: Diagnosis not present

## 2019-03-08 DIAGNOSIS — M79672 Pain in left foot: Secondary | ICD-10-CM

## 2019-03-08 NOTE — Progress Notes (Signed)
Office Visit Note   Patient: Keith Mejia           Date of Birth: [birth year]           MRN: [id number] Visit Date: 03/08/2019              Requested by: Kerin Perna, NP [address] [place],  [place] [address] PCP: Kerin Perna, NP   Assessment & Plan: Visit Diagnoses:  1. Pain in left foot   2. Pain in right foot     Plan:  We will have him obtain a off-the-shelf orthotics with medial hindfoot wedge.  Recommend these are pliable.  Also recommended he try [place] wide shoe or a new balance walking shoe.  He is to be fitted for these.  He will apply Voltaren gel 4 times daily to the lateral aspects of both fifth metatarsal base.  We will see him back in 3 weeks see how he is doing in regards to his feet.  At that time I did like an AP pelvis and lateral view of both hips as he is complaining of bilateral hip pain and has a history of a right hip replacement in the past.  Follow-Up Instructions: Return in about 3 weeks (around 03/29/2019).   Orders:  Orders Placed This Encounter  Procedures  . XR Foot 2 Views Left  . XR Foot 2 Views Right   No orders of the defined types were placed in this encounter.     Procedures: No procedures performed   Clinical Data: No additional findings.   Subjective: Chief Complaint  Patient presents with  . Left Foot - Pain  . Right Foot - Pain    HPI Keith Mejia is a 58 year old male who we have not seen him in a number of years.  He has undergone right hip replacement by Dr. Ninfa Linden back in 2012.  He states he still has pain in both hips but he is referred here by nurse practitioner Jodene Nam for bilateral foot pain.  He states he has knots on the lateral aspect of both feet there is sensitive to touch.  He is tried multiple new shoes and inserts without any relief.  He has had no new injury to either foot.  Review of Systems Negative for fevers chills shortness of breath chest pain.  Please see HPI  Objective:  Vital Signs: There were no vitals taken for this visit.  Physical Exam Constitutional:      Appearance: He is normal weight. He is not ill-appearing or diaphoretic.  Pulmonary:     Effort: Pulmonary effort is normal.  Neurological:     Mental Status: He is alert and oriented to person, place, and time.     Ortho Exam Bilateral feet no rashes skin lesions ulcerations or impending ulcers ecchymosis or erythema.  He has prominence at the fifth metatarsal base laterally.  Pes planus bilaterally acquired.  Inversion eversion bilateral feet 5 out of 5 strength without pain.  He is nontender over the posterior tibial tendons peroneal tendons.  Good range of motion bilateral ankles. Specialty Comments:  No specialty comments available.  Imaging: Xr Foot 2 Views Left  Result Date: 03/08/2019 Left foot 3 views.  No acute fracture.  No bony abnormalities.  Dorsal spur off the navicular at the talonavicular joint.  Calcaneal heel spur present.  Lisfranc joints well maintained  Xr Foot 2 Views Right  Result Date: 03/08/2019 Right foot 3 views: No acute fractures.  No bony abnormalities.  Accessory type navicular.  Calcaneal heel spur.  Lisfranc joint is well-maintained.    PMFS History: There are no active problems to display for this patient.  Past Medical History:  Diagnosis Date  . Arthritis 06-03-11   osteoarthritis-rt. hip, lt shoulder  . Bronchitis 06-03-11   x1 -a few yrs ago, none recent  . GERD (gastroesophageal reflux disease) 06-03-11   as needed Prilosec OTC  . Headache(784.0) 06-03-11   sinus related, none recent    Family History  Problem Relation Age of Onset  . Diabetes Mother   . Cancer Father     Past Surgical History:  Procedure Laterality Date  . TOTAL HIP ARTHROPLASTY  06/04/2011   Procedure: TOTAL HIP ARTHROPLASTY ANTERIOR APPROACH;  Surgeon: Mcarthur Rossetti;  Location: [facility];  Service: Orthopedics;  Laterality: Right;  Right Total Hip Arthroplasty,  Direct Anterior Approach    (c-arm)   Social History   Occupational History  . Not on file  Tobacco Use  . Smoking status: Current Every Day Smoker    Packs/day: 0.50    Years: 35.00    Pack years: 17.50    Types: Cigarettes  . Smokeless tobacco: Never Used  Substance and Sexual Activity  . Alcohol use: Yes    Comment: occasional  . Drug use: Yes    Types: Marijuana  . Sexual activity: Yes

## 2019-03-26 ENCOUNTER — Ambulatory Visit (INDEPENDENT_AMBULATORY_CARE_PROVIDER_SITE_OTHER): Payer: Medicare HMO | Admitting: Primary Care

## 2019-03-29 ENCOUNTER — Ambulatory Visit: Payer: Medicare HMO | Admitting: Orthopaedic Surgery

## 2020-01-04 ENCOUNTER — Ambulatory Visit (INDEPENDENT_AMBULATORY_CARE_PROVIDER_SITE_OTHER): Payer: Medicare HMO | Admitting: Primary Care

## 2020-04-02 ENCOUNTER — Other Ambulatory Visit: Payer: Self-pay

## 2020-04-02 ENCOUNTER — Ambulatory Visit (INDEPENDENT_AMBULATORY_CARE_PROVIDER_SITE_OTHER): Payer: Medicare HMO

## 2020-04-02 ENCOUNTER — Ambulatory Visit (INDEPENDENT_AMBULATORY_CARE_PROVIDER_SITE_OTHER): Payer: Medicare HMO | Admitting: Primary Care

## 2020-04-02 ENCOUNTER — Encounter (INDEPENDENT_AMBULATORY_CARE_PROVIDER_SITE_OTHER): Payer: Self-pay | Admitting: Primary Care

## 2020-04-02 VITALS — BP 139/80 | HR 73 | Temp 97.5°F | Ht 72.0 in | Wt 179.8 lb

## 2020-04-02 DIAGNOSIS — F17200 Nicotine dependence, unspecified, uncomplicated: Secondary | ICD-10-CM

## 2020-04-02 DIAGNOSIS — D1721 Benign lipomatous neoplasm of skin and subcutaneous tissue of right arm: Secondary | ICD-10-CM | POA: Diagnosis not present

## 2020-04-02 DIAGNOSIS — Z0130 Encounter for examination of blood pressure without abnormal findings: Secondary | ICD-10-CM

## 2020-04-02 DIAGNOSIS — E782 Mixed hyperlipidemia: Secondary | ICD-10-CM

## 2020-04-02 DIAGNOSIS — Z79899 Other long term (current) drug therapy: Secondary | ICD-10-CM | POA: Diagnosis not present

## 2020-04-02 DIAGNOSIS — Z1211 Encounter for screening for malignant neoplasm of colon: Secondary | ICD-10-CM | POA: Diagnosis not present

## 2020-04-02 DIAGNOSIS — Z23 Encounter for immunization: Secondary | ICD-10-CM | POA: Diagnosis not present

## 2020-04-02 MED ORDER — LOSARTAN POTASSIUM-HCTZ 100-25 MG PO TABS: 1.0000 | ORAL_TABLET | Freq: Every day | ORAL | 3 refills | Status: DC

## 2020-04-02 NOTE — Progress Notes (Signed)
[facility] for  hypertension evaluation, on previous visit medication was adjusted to include Patient reports adherence with medications.  Current Medication List Current Outpatient Medications on File Prior to Visit  Medication Sig Dispense Refill  . atorvastatin (LIPITOR) 40 MG tablet Take 1 tablet (40 mg total) by mouth daily. 90 tablet 3  . losartan-hydrochlorothiazide (HYZAAR) 100-25 MG tablet Take 1 tablet by mouth daily. 90 tablet 3   No current facility-administered medications on file prior to visit.   Past Medical History  Past Medical History:  Diagnosis Date  . Arthritis 06-03-11   osteoarthritis-rt. hip, lt shoulder  . Bronchitis 06-03-11   x1 -a few yrs ago, none recent  . GERD (gastroesophageal reflux disease) 06-03-11   as needed Prilosec OTC  . Headache(784.0) 06-03-11   sinus related, none recent   Dietary habits include: low sodium low carb  Exercise habits include:walking  Family / Social history: DM/MI  ASCVD risk factors include- [place]  O:  Physical Exam Vitals reviewed.  Constitutional:      Appearance: Normal appearance. He is normal weight.  HENT:     Head: Normocephalic.     Nose: Nose normal.  Eyes:     Extraocular Movements: Extraocular movements intact.  Cardiovascular:     Rate and Rhythm: Normal rate and regular rhythm.  Pulmonary:     Effort: Pulmonary effort is normal.     Breath sounds: Normal breath sounds.  Abdominal:     General: Bowel sounds are normal.     Palpations: Abdomen is soft.  Musculoskeletal:        General: Normal range of motion.     Cervical back: Normal range of motion.  Skin:    General: Skin is warm and dry.     Comments: Lipoma vs grandular cyst right wrist  Neurological:     Mental Status: He is alert and oriented to person, place, and time.  Psychiatric:        Mood and Affect: Mood normal.        Behavior: Behavior normal.        Thought Content: Thought content  normal.        Judgment: Judgment normal.      Review of Systems  All other systems reviewed and are negative.   Last 3 Office BP readings: BP Readings from Last 3 Encounters:  04/02/20 139/80  02/26/19 (!) 154/88  08/17/18 (!) 152/89    BMET    Component Value Date/Time   NA 144 02/26/2019 0909   K 4.1 02/26/2019 0909   CL 107 (H) 02/26/2019 0909   CO2 22 02/26/2019 0909   GLUCOSE 98 02/26/2019 0909   GLUCOSE 110 (H) 06/05/2011 0443   BUN 15 02/26/2019 0909   CREATININE 1.23 02/26/2019 0909   CALCIUM 9.4 02/26/2019 0909   GFRNONAA 64 02/26/2019 0909   GFRAA 74 02/26/2019 0909    Renal function: CrCl cannot be calculated (Patient's most recent lab result is older than the maximum 21 days allowed.).  Clinical ASCVD: Yes  The 10-year ASCVD risk score Mikey Bussing DC Jr., et al., 2013) is: 26.4%   Values used to calculate the score:     Age: 59 years     Sex: Male     Is Non-Hispanic African American: Yes     Diabetic: No     Tobacco smoker: Yes     Systolic Blood Pressure: 671 mmHg     Is BP treated: Yes  HDL Cholesterol: 41 mg/dL     Total Cholesterol: 219 mg/dL   A/P: Numair was seen today for follow-up and medication refill.  Diagnoses and all orders for this visit:  Blood pressure check Hypertension longstanding diagnosed currently. On  losartan-hydrochlorothiazide  current medications. BP Goal = 130/80  mmHg. Patient is adherent with current medications.  -Continued -F/u labs ordered - lipid, cmp cbc,  -Counseled on lifestyle modifications for blood pressure control including reduced dietary sodium, increased exercise, adequate sleep  -     CMP14+EGFR; Future -     losartan-hydrochlorothiazide (HYZAAR) 100-25 MG tablet; Take 1 tablet by mouth daily.  Colon cancer screening -     Ambulatory referral to Gastroenterology  Mixed hyperlipidemia Recently change  Diet to cut out fried foods ,red meat, pork and rich foods . -     Lipid Panel;  Future  Medication management -     CBC with Differential/Platelet; Future -     CMP14+EGFR; Future  Need for immunization against influenza CDC recommends influenza vaccine yearly.   -     Flu Vaccine QUAD 36+ mos IM  Lipoma of right upper extremity   Lipoma vs grandular cyst right wrist   -     Ambulatory referral to General Surgery   Tobacco dependence  He is trying to stop smoking down to 8 cigarette. Well aware  day other respiratory diseases recommend cessation.  This will be reminded at each clinical visit.     Kerin Perna

## 2020-04-02 NOTE — Patient Instructions (Signed)

## 2020-04-03 ENCOUNTER — Other Ambulatory Visit (INDEPENDENT_AMBULATORY_CARE_PROVIDER_SITE_OTHER): Payer: Medicare HMO

## 2020-04-03 DIAGNOSIS — E782 Mixed hyperlipidemia: Secondary | ICD-10-CM | POA: Diagnosis not present

## 2020-04-03 DIAGNOSIS — Z1211 Encounter for screening for malignant neoplasm of colon: Secondary | ICD-10-CM | POA: Diagnosis not present

## 2020-04-03 DIAGNOSIS — Z0130 Encounter for examination of blood pressure without abnormal findings: Secondary | ICD-10-CM | POA: Diagnosis not present

## 2020-04-03 DIAGNOSIS — Z79899 Other long term (current) drug therapy: Secondary | ICD-10-CM | POA: Diagnosis not present

## 2020-04-04 LAB — CBC WITH DIFFERENTIAL/PLATELET
Basophils Absolute: 0 10*3/uL (ref 0.0–0.2)
Basos: 1 %
EOS (ABSOLUTE): 0.1 10*3/uL (ref 0.0–0.4)
Eos: 1 %
Hematocrit: 42.9 % (ref 37.5–51.0)
Hemoglobin: 14 g/dL (ref 13.0–17.7)
Immature Grans (Abs): 0 10*3/uL (ref 0.0–0.1)
Immature Granulocytes: 0 %
Lymphocytes Absolute: 3 10*3/uL (ref 0.7–3.1)
Lymphs: 46 %
MCH: 29 pg (ref 26.6–33.0)
MCHC: 32.6 g/dL (ref 31.5–35.7)
MCV: 89 fL (ref 79–97)
Monocytes Absolute: 0.5 10*3/uL (ref 0.1–0.9)
Monocytes: 7 %
Neutrophils Absolute: 2.9 10*3/uL (ref 1.4–7.0)
Neutrophils: 45 %
Platelets: 303 10*3/uL (ref 150–450)
RBC: 4.83 x10E6/uL (ref 4.14–5.80)
RDW: 13.1 % (ref 11.6–15.4)
WBC: 6.6 10*3/uL (ref 3.4–10.8)

## 2020-04-04 LAB — LIPID PANEL
Chol/HDL Ratio: 6.1 ratio — ABNORMAL HIGH (ref 0.0–5.0)
Cholesterol, Total: 209 mg/dL — ABNORMAL HIGH (ref 100–199)
HDL: 34 mg/dL — ABNORMAL LOW (ref >39)
LDL Chol Calc (NIH): 149 mg/dL — ABNORMAL HIGH (ref 0–99)
Triglycerides: 140 mg/dL (ref 0–149)
VLDL Cholesterol Cal: 26 mg/dL (ref 5–40)

## 2020-04-04 LAB — CMP14+EGFR
ALT: 4 IU/L (ref 0–44)
AST: 17 IU/L (ref 0–40)
Albumin/Globulin Ratio: 1.5 (ref 1.2–2.2)
Albumin: 4.1 g/dL (ref 3.8–4.9)
Alkaline Phosphatase: 121 IU/L (ref 44–121)
BUN/Creatinine Ratio: 10 (ref 9–20)
BUN: 11 mg/dL (ref 6–24)
Bilirubin Total: 0.4 mg/dL (ref 0.0–1.2)
CO2: 22 mmol/L (ref 20–29)
Calcium: 9.6 mg/dL (ref 8.7–10.2)
Chloride: 105 mmol/L (ref 96–106)
Creatinine, Ser: 1.08 mg/dL (ref 0.76–1.27)
GFR calc Af Amer: 86 mL/min/{1.73_m2} (ref >59)
GFR calc non Af Amer: 75 mL/min/{1.73_m2} (ref >59)
Globulin, Total: 2.8 g/dL (ref 1.5–4.5)
Glucose: 91 mg/dL (ref 65–99)
Potassium: 4.4 mmol/L (ref 3.5–5.2)
Sodium: 141 mmol/L (ref 134–144)
Total Protein: 6.9 g/dL (ref 6.0–8.5)

## 2020-04-04 LAB — FECAL OCCULT BLOOD, IMMUNOCHEMICAL: Fecal Occult Bld: NEGATIVE

## 2020-04-08 ENCOUNTER — Other Ambulatory Visit (INDEPENDENT_AMBULATORY_CARE_PROVIDER_SITE_OTHER): Payer: Self-pay | Admitting: Primary Care

## 2020-04-08 DIAGNOSIS — E782 Mixed hyperlipidemia: Secondary | ICD-10-CM

## 2020-04-08 MED ORDER — ATORVASTATIN CALCIUM 40 MG PO TABS: 40.0000 mg | ORAL_TABLET | Freq: Every day | ORAL | 3 refills | Status: DC

## 2020-07-03 ENCOUNTER — Ambulatory Visit (INDEPENDENT_AMBULATORY_CARE_PROVIDER_SITE_OTHER): Payer: Medicare HMO | Admitting: Primary Care

## 2020-07-03 ENCOUNTER — Encounter (INDEPENDENT_AMBULATORY_CARE_PROVIDER_SITE_OTHER): Payer: Self-pay | Admitting: Primary Care

## 2020-07-03 ENCOUNTER — Other Ambulatory Visit: Payer: Self-pay

## 2020-07-03 VITALS — BP 159/91 | HR 80 | Temp 97.9°F | Ht 72.0 in | Wt 183.4 lb

## 2020-07-03 DIAGNOSIS — M6740 Ganglion, unspecified site: Secondary | ICD-10-CM

## 2020-07-03 NOTE — Patient Instructions (Signed)
Managing Your Hypertension Hypertension, also called high blood pressure, is when the force of the blood pressing against the walls of the arteries is too strong. Arteries are blood vessels that carry blood from your heart throughout your body. Hypertension forces the heart to work harder to pump blood and may cause the arteries to become narrow or stiff. Understanding blood pressure readings Your personal target blood pressure may vary depending on your medical conditions, your age, and other factors. A blood pressure reading includes a higher number over a lower number. Ideally, your blood pressure should be below 120/80. You should know that:  The first, or top, number is called the systolic pressure. It is a measure of the pressure in your arteries as your heart beats.  The second, or bottom number, is called the diastolic pressure. It is a measure of the pressure in your arteries as the heart relaxes. Blood pressure is classified into four stages. Based on your blood pressure reading, your health care provider may use the following stages to determine what type of treatment you need, if any. Systolic pressure and diastolic pressure are measured in a unit called mmHg. Normal  Systolic pressure: below 120.  Diastolic pressure: below 80. Elevated  Systolic pressure: 120-129.  Diastolic pressure: below 80. Hypertension stage 1  Systolic pressure: 130-139.  Diastolic pressure: 80-89. Hypertension stage 2  Systolic pressure: 140 or above.  Diastolic pressure: 90 or above. How can this condition affect me? Managing your hypertension is an important responsibility. Over time, hypertension can damage the arteries and decrease blood flow to important parts of the body, including the brain, heart, and kidneys. Having untreated or uncontrolled hypertension can lead to:  A heart attack.  A stroke.  A weakened blood vessel (aneurysm).  Heart failure.  Kidney damage.  Eye  damage.  Metabolic syndrome.  Memory and concentration problems.  Vascular dementia. What actions can I take to manage this condition? Hypertension can be managed by making lifestyle changes and possibly by taking medicines. Your health care provider will help you make a plan to bring your blood pressure within a normal range. Nutrition  Eat a diet that is high in fiber and potassium, and low in salt (sodium), added sugar, and fat. An example eating plan is called the Dietary Approaches to Stop Hypertension (DASH) diet. To eat this way: ? Eat plenty of fresh fruits and vegetables. Try to fill one-half of your plate at each meal with fruits and vegetables. ? Eat whole grains, such as whole-wheat pasta, brown rice, or whole-grain bread. Fill about one-fourth of your plate with whole grains. ? Eat low-fat dairy products. ? Avoid fatty cuts of meat, processed or cured meats, and poultry with skin. Fill about one-fourth of your plate with lean proteins such as fish, chicken without skin, beans, eggs, and tofu. ? Avoid pre-made and processed foods. These tend to be higher in sodium, added sugar, and fat.  Reduce your daily sodium intake. Most people with hypertension should eat less than 1,500 mg of sodium a day.   Lifestyle  Work with your health care provider to maintain a healthy body weight or to lose weight. Ask what an ideal weight is for you.  Get at least 30 minutes of exercise that causes your heart to beat faster (aerobic exercise) most days of the week. Activities may include walking, swimming, or biking.  Include exercise to strengthen your muscles (resistance exercise), such as weight lifting, as part of your weekly exercise routine. Try   to do these types of exercises for 30 minutes at least 3 days a week.  Do not use any products that contain nicotine or tobacco, such as cigarettes, e-cigarettes, and chewing tobacco. If you need help quitting, ask your health care  provider.  Control any long-term (chronic) conditions you have, such as high cholesterol or diabetes.  Identify your sources of stress and find ways to manage stress. This may include meditation, deep breathing, or making time for fun activities.   Alcohol use  Do not drink alcohol if: ? Your health care provider tells you not to drink. ? You are pregnant, may be pregnant, or are planning to become pregnant.  If you drink alcohol: ? Limit how much you use to:  0-1 drink a day for women.  0-2 drinks a day for men. ? Be aware of how much alcohol is in your drink. In the [place] one drink equals one 12 oz bottle of beer (355 mL), one 5 oz glass of wine (148 mL), or one 1 oz glass of hard liquor (44 mL). Medicines Your health care provider may prescribe medicine if lifestyle changes are not enough to get your blood pressure under control and if:  Your systolic blood pressure is 130 or higher.  Your diastolic blood pressure is 80 or higher. Take medicines only as told by your health care provider. Follow the directions carefully. Blood pressure medicines must be taken as told by your health care provider. The medicine does not work as well when you skip doses. Skipping doses also puts you at risk for problems. Monitoring Before you monitor your blood pressure:  Do not smoke, drink caffeinated beverages, or exercise within 30 minutes before taking a measurement.  Use the bathroom and empty your bladder (urinate).  Sit quietly for at least 5 minutes before taking measurements. Monitor your blood pressure at home as told by your health care provider. To do this:  Sit with your back straight and supported.  Place your feet flat on the floor. Do not cross your legs.  Support your arm on a flat surface, such as a table. Make sure your upper arm is at heart level.  Each time you measure, take two or three readings one minute apart and record the results. You may also need to have your  blood pressure checked regularly by your health care provider.   General information  Talk with your health care provider about your diet, exercise habits, and other lifestyle factors that may be contributing to hypertension.  Review all the medicines you take with your health care provider because there may be side effects or interactions.  Keep all visits as told by your health care provider. Your health care provider can help you create and adjust your plan for managing your high blood pressure. Where to find more information  National Heart, Lung, and Blood Institute: www.nhlbi.nih.gov  American Heart Association: www.heart.org Contact a health care provider if:  You think you are having a reaction to medicines you have taken.  You have repeated (recurrent) headaches.  You feel dizzy.  You have swelling in your ankles.  You have trouble with your vision. Get help right away if:  You develop a severe headache or confusion.  You have unusual weakness or numbness, or you feel faint.  You have severe pain in your chest or abdomen.  You vomit repeatedly.  You have trouble breathing. These symptoms may represent a serious problem that is an emergency. Do not wait   to see if the symptoms will go away. Get medical help right away. Call your local emergency services (911 in the [place] Do not drive yourself to the hospital. Summary  Hypertension is when the force of blood pumping through your arteries is too strong. If this condition is not controlled, it may put you at risk for serious complications.  Your personal target blood pressure may vary depending on your medical conditions, your age, and other factors. For most people, a normal blood pressure is less than 120/80.  Hypertension is managed by lifestyle changes, medicines, or both.  Lifestyle changes to help manage hypertension include losing weight, eating a healthy, low-sodium diet, exercising more, stopping smoking, and  limiting alcohol. This information is not intended to replace advice given to you by your health care provider. Make sure you discuss any questions you have with your health care provider. Document Revised: 06/29/2019 Document Reviewed: 04/24/2019 Elsevier Patient Education  2021 Elsevier Inc.  

## 2020-07-03 NOTE — Progress Notes (Signed)
Keith  Mr. Keith Mejia. Mejia is a 60 year old male in for  hypertension evaluation, on previous visit medication was adjusted to include hyzaar 100/50 daily he admits to forgetting to take bp medication.  Discussed with patient risk factors African-American male, smoker greater for heart attack and stroke.  He is currently only managing building his house.  Explain what will be nicer having your blood pressure control and in your house or having a stroke or heart attack in a nursing home.  Patient then decides he will try very hard to take his blood pressure medication every day. Denies shortness of breath, headaches, chest pain or lower extremity edema, sudden onset, vision changes, unilateral weakness, dizziness, paresthesias Patient reports adherence with medications.  Current Medication List Current Outpatient Medications on File Prior to Visit  Medication Sig Dispense Refill  . atorvastatin (LIPITOR) 40 MG tablet Take 1 tablet (40 mg total) by mouth daily. 90 tablet 3  . losartan-hydrochlorothiazide (HYZAAR) 100-25 MG tablet Take 1 tablet by mouth daily. 90 tablet 3   No current facility-administered medications on file prior to visit.   Past Medical History  Past Medical History:  Diagnosis Date  . Arthritis 06-03-11   osteoarthritis-rt. hip, lt shoulder  . Bronchitis 06-03-11   x1 -a few yrs ago, none recent  . GERD (gastroesophageal reflux disease) 06-03-11   as needed Prilosec OTC  . Headache(784.0) 06-03-11   sinus related, none recent   Dietary habits include: eats only once daily no pork and use little to no salt  Exercise habits include: working  Family / Social history: No   ASCVD risk factors include- Keith Mejia  O:  Physical Exam Vitals reviewed.  HENT:     Head: Normocephalic.     Right Ear: Tympanic membrane normal.     Left Ear: Tympanic membrane normal.     Nose: Nose normal.  Eyes:     Extraocular Movements: Extraocular movements intact.      Pupils: Pupils are equal, round, and reactive to light.  Cardiovascular:     Rate and Rhythm: Normal rate and regular rhythm.  Pulmonary:     Effort: Pulmonary effort is normal.     Breath sounds: Normal breath sounds.  Musculoskeletal:        General: Normal range of motion.     Cervical back: Normal range of motion.  Skin:    General: Skin is warm and dry.  Neurological:     Mental Status: He is alert and oriented to person, place, and time.  Psychiatric:        Mood and Affect: Mood normal.        Behavior: Behavior normal.        Thought Content: Thought content normal.        Judgment: Judgment normal.      Review of Systems  All other systems reviewed and are negative.   Last 3 Office BP readings: BP Readings from Last 3 Encounters:  07/03/20 (!) 159/91  04/02/20 139/80  02/26/19 (!) 154/88    BMET    Component Value Date/Time   NA 141 04/03/2020 0852   K 4.4 04/03/2020 0852   CL 105 04/03/2020 0852   CO2 22 04/03/2020 0852   GLUCOSE 91 04/03/2020 0852   GLUCOSE 110 (H) 06/05/2011 0443   BUN 11 04/03/2020 0852   CREATININE 1.08 04/03/2020 0852   CALCIUM 9.6 04/03/2020 0852   GFRNONAA 75 04/03/2020 0852   GFRAA 86 04/03/2020 0852  Renal function: CrCl cannot be calculated (Patient's most recent lab result is older than the maximum 21 days allowed.).  Clinical ASCVD: Yes  The 10-year ASCVD risk score Mikey Bussing DC Jr., et al., 2013) is: 33.9%   Values used to calculate the score:     Age: 89 years     Sex: Male     Is Non-Hispanic African American: Yes     Diabetic: No     Tobacco smoker: Yes     Systolic Blood Pressure: 115 mmHg     Is BP treated: Yes     HDL Cholesterol: 34 mg/dL     Total Cholesterol: 209 mg/dL   A/P:right  Hypertension longstanding diagnosed currently hyzaar 100/50 on current medications. BP Goal = 130/80  mmHg. Patient is not adherent with current medications.  -Continued  -F/u labs ordered -  -Counseled on lifestyle  modifications for blood pressure control including reduced dietary sodium, increased exercise, adequate sleep  Kerin Perna

## 2020-07-18 ENCOUNTER — Ambulatory Visit (INDEPENDENT_AMBULATORY_CARE_PROVIDER_SITE_OTHER): Payer: Medicare HMO | Admitting: Primary Care

## 2020-07-25 ENCOUNTER — Other Ambulatory Visit (INDEPENDENT_AMBULATORY_CARE_PROVIDER_SITE_OTHER): Payer: Self-pay | Admitting: Primary Care

## 2020-07-25 DIAGNOSIS — E782 Mixed hyperlipidemia: Secondary | ICD-10-CM

## 2020-08-04 ENCOUNTER — Encounter (INDEPENDENT_AMBULATORY_CARE_PROVIDER_SITE_OTHER): Payer: Self-pay | Admitting: Primary Care

## 2020-08-04 ENCOUNTER — Ambulatory Visit (INDEPENDENT_AMBULATORY_CARE_PROVIDER_SITE_OTHER): Payer: Medicare HMO | Admitting: Primary Care

## 2020-08-04 ENCOUNTER — Other Ambulatory Visit: Payer: Self-pay

## 2020-08-04 VITALS — BP 108/71 | HR 63 | Temp 97.7°F | Ht 72.0 in | Wt 177.6 lb

## 2020-08-04 DIAGNOSIS — E782 Mixed hyperlipidemia: Secondary | ICD-10-CM

## 2020-08-04 DIAGNOSIS — Z0130 Encounter for examination of blood pressure without abnormal findings: Secondary | ICD-10-CM

## 2020-08-04 DIAGNOSIS — I10 Essential (primary) hypertension: Secondary | ICD-10-CM | POA: Diagnosis not present

## 2020-08-04 MED ORDER — LOSARTAN POTASSIUM-HCTZ 100-25 MG PO TABS: 1.0000 | ORAL_TABLET | Freq: Every day | ORAL | 3 refills | Status: DC

## 2020-08-04 NOTE — Patient Instructions (Signed)

## 2020-08-04 NOTE — Progress Notes (Signed)
[place] for  hypertension evaluation, on previous visit medication was not adjusted . Diiscussed risk factors of HTN- started taking medication daily  Patient reports adherence with medications. Denies shortness of breath, headaches, chest pain or lower extremity edema  Current Medication List Current Outpatient Medications on File Prior to Visit  Medication Sig Dispense Refill  . atorvastatin (LIPITOR) 40 MG tablet TAKE 1 TABLET BY MOUTH EVERY DAY 30 tablet 0   No current facility-administered medications on file prior to visit.   Past Medical History  Past Medical History:  Diagnosis Date  . Arthritis 06-03-11   osteoarthritis-rt. hip, lt shoulder  . Bronchitis 06-03-11   x1 -a few yrs ago, none recent  . GERD (gastroesophageal reflux disease) 06-03-11   as needed Prilosec OTC  . Headache(784.0) 06-03-11   sinus related, none recent   Dietary habits include: low sodium diet  Exercise habits include:yes  Family / Social history: No ASCVD risk factors include- [place]  O:  Physical Exam Vitals reviewed.  HENT:     Right Ear: External ear normal.     Left Ear: External ear normal.  Eyes:     Extraocular Movements: Extraocular movements intact.  Cardiovascular:     Rate and Rhythm: Normal rate and regular rhythm.  Pulmonary:     Effort: Pulmonary effort is normal.     Breath sounds: Normal breath sounds.  Abdominal:     General: Abdomen is flat. Bowel sounds are normal.  Musculoskeletal:        General: Normal range of motion.     Cervical back: Normal range of motion.  Skin:    General: Skin is dry.  Neurological:     Mental Status: He is alert and oriented to person, place, and time.  Psychiatric:        Mood and Affect: Mood normal.        Behavior: Behavior normal.        Thought Content: Thought content normal.        Judgment: Judgment normal.      ROS Pertinent positive and negative noted in HPI  Last 3 Office BP  readings: BP Readings from Last 3 Encounters:  08/04/20 108/71  07/03/20 (!) 159/91  04/02/20 139/80    BMET    Component Value Date/Time   NA 141 04/03/2020 0852   K 4.4 04/03/2020 0852   CL 105 04/03/2020 0852   CO2 22 04/03/2020 0852   GLUCOSE 91 04/03/2020 0852   GLUCOSE 110 (H) 06/05/2011 0443   BUN 11 04/03/2020 0852   CREATININE 1.08 04/03/2020 0852   CALCIUM 9.6 04/03/2020 0852   GFRNONAA 75 04/03/2020 0852   GFRAA 86 04/03/2020 0852    Renal function: CrCl cannot be calculated (Patient's most recent lab result is older than the maximum 21 days allowed.).  Clinical ASCVD: Yes  The 10-year ASCVD risk score Mikey Bussing DC Jr., et al., 2013) is: 17.9%   Values used to calculate the score:     Age: 60 years     Sex: Male     Is Non-Hispanic African American: Yes     Diabetic: No     Tobacco smoker: Yes     Systolic Blood Pressure: 149 mmHg     Is BP treated: Yes     HDL Cholesterol: 34 mg/dL     Total Cholesterol: 209 mg/dL   A/P: Hypertension longstanding diagnosed currently on Hyzaar 100/50 daily  current medications. BP Goal met <  130/71mHg. Patient is adherent with current medications.  -Continued -F/u labs ordered -Lipids  -Counseled on lifestyle modifications for blood pressure control including reduced dietary sodium, increased exercise, adequate sleep  MKerin Perna

## 2020-08-14 ENCOUNTER — Other Ambulatory Visit (INDEPENDENT_AMBULATORY_CARE_PROVIDER_SITE_OTHER): Payer: Medicare HMO

## 2020-08-16 ENCOUNTER — Other Ambulatory Visit (INDEPENDENT_AMBULATORY_CARE_PROVIDER_SITE_OTHER): Payer: Self-pay | Admitting: Primary Care

## 2020-08-16 DIAGNOSIS — E782 Mixed hyperlipidemia: Secondary | ICD-10-CM

## 2020-08-16 NOTE — Telephone Encounter (Signed)
Requested Prescriptions  Pending Prescriptions Disp Refills  . atorvastatin (LIPITOR) 40 MG tablet [Pharmacy Med Name: ATORVASTATIN 40 MG TABLET] 30 tablet 0    Sig: TAKE 1 TABLET BY MOUTH EVERY DAY     Cardiovascular:  Antilipid - Statins Failed - 08/16/2020  8:30 AM      Failed - Total Cholesterol in normal range and within 360 days    Cholesterol, Total  Date Value Ref Range Status  04/03/2020 209 (H) 100 - 199 mg/dL Final         Failed - LDL in normal range and within 360 days    LDL Chol Calc (NIH)  Date Value Ref Range Status  04/03/2020 149 (H) 0 - 99 mg/dL Final         Failed - HDL in normal range and within 360 days    HDL  Date Value Ref Range Status  04/03/2020 34 (L) >39 mg/dL Final         Passed - Triglycerides in normal range and within 360 days    Triglycerides  Date Value Ref Range Status  04/03/2020 140 0 - 149 mg/dL Final         Passed - Patient is not pregnant      Passed - Valid encounter within last 12 months    Recent Outpatient Visits          1 week ago Mixed hyperlipidemia   [place], Tywan Siever P, NP   1 month ago Ganglion cyst   [place], Chrisangel Eskenazi P, NP   4 months ago Blood pressure check   [facility] Kerin Perna, NP   1 year ago Essential hypertension   [place], [place], NP   2 years ago Need for prophylactic vaccination and inoculation against influenza   [place], [place], NP      Future Appointments            In 3 months Oletta Lamas, [place] Cage, NP [place]

## 2020-11-23 ENCOUNTER — Other Ambulatory Visit (INDEPENDENT_AMBULATORY_CARE_PROVIDER_SITE_OTHER): Payer: Self-pay | Admitting: Primary Care

## 2020-11-23 DIAGNOSIS — E782 Mixed hyperlipidemia: Secondary | ICD-10-CM

## 2020-11-23 NOTE — Telephone Encounter (Signed)
Requested Prescriptions  Pending Prescriptions Disp Refills  . atorvastatin (LIPITOR) 40 MG tablet [Pharmacy Med Name: ATORVASTATIN 40 MG TABLET] 90 tablet 0    Sig: TAKE 1 TABLET BY MOUTH EVERY DAY     Cardiovascular:  Antilipid - Statins Failed - 11/23/2020 12:06 AM      Failed - Total Cholesterol in normal range and within 360 days    Cholesterol, Total  Date Value Ref Range Status  04/03/2020 209 (H) 100 - 199 mg/dL Final         Failed - LDL in normal range and within 360 days    LDL Chol Calc (NIH)  Date Value Ref Range Status  04/03/2020 149 (H) 0 - 99 mg/dL Final         Failed - HDL in normal range and within 360 days    HDL  Date Value Ref Range Status  04/03/2020 34 (L) >39 mg/dL Final         Passed - Triglycerides in normal range and within 360 days    Triglycerides  Date Value Ref Range Status  04/03/2020 140 0 - 149 mg/dL Final         Passed - Patient is not pregnant      Passed - Valid encounter within last 12 months    Recent Outpatient Visits          3 months ago Mixed hyperlipidemia   [place], Michelle P, NP   4 months ago Ganglion cyst   [place], Michelle P, NP   7 months ago Blood pressure check   [facility] Kerin Perna, NP   1 year ago Essential hypertension   [place], [place], NP   2 years ago Need for prophylactic vaccination and inoculation against influenza   [place], [place], NP      Future Appointments            In 1 week Oletta Lamas [place] Cage, NP [place]

## 2020-12-02 ENCOUNTER — Other Ambulatory Visit: Payer: Self-pay

## 2020-12-02 ENCOUNTER — Encounter (INDEPENDENT_AMBULATORY_CARE_PROVIDER_SITE_OTHER): Payer: Self-pay | Admitting: Primary Care

## 2020-12-02 ENCOUNTER — Ambulatory Visit (INDEPENDENT_AMBULATORY_CARE_PROVIDER_SITE_OTHER): Payer: Medicare HMO | Admitting: Primary Care

## 2020-12-02 VITALS — BP 119/87 | HR 98 | Temp 97.5°F | Resp 16 | Wt 168.0 lb

## 2020-12-02 DIAGNOSIS — Z0130 Encounter for examination of blood pressure without abnormal findings: Secondary | ICD-10-CM | POA: Diagnosis not present

## 2020-12-02 DIAGNOSIS — F17210 Nicotine dependence, cigarettes, uncomplicated: Secondary | ICD-10-CM

## 2020-12-02 DIAGNOSIS — G8929 Other chronic pain: Secondary | ICD-10-CM

## 2020-12-02 DIAGNOSIS — E782 Mixed hyperlipidemia: Secondary | ICD-10-CM

## 2020-12-02 DIAGNOSIS — M25512 Pain in left shoulder: Secondary | ICD-10-CM | POA: Diagnosis not present

## 2020-12-02 DIAGNOSIS — Z23 Encounter for immunization: Secondary | ICD-10-CM

## 2020-12-02 DIAGNOSIS — F17200 Nicotine dependence, unspecified, uncomplicated: Secondary | ICD-10-CM

## 2020-12-02 MED ORDER — LOSARTAN POTASSIUM-HCTZ 100-25 MG PO TABS: 1.0000 | ORAL_TABLET | Freq: Every day | ORAL | 3 refills | Status: AC

## 2020-12-02 NOTE — Patient Instructions (Signed)
Shoulder Pain °Many things can cause shoulder pain, including: °An injury to the shoulder. °Overuse of the shoulder. °Arthritis. °The source of the pain can be: °Inflammation. °An injury to the shoulder joint. °An injury to a tendon, ligament, or bone. °Follow these instructions at home: °Pay attention to changes in your symptoms. Let your health care provider know about them. Follow these instructions to relieve your pain. °If you have a sling: °Wear the sling as told by your health care provider. Remove it only as told by your health care provider. °Loosen the sling if your fingers tingle, become numb, or turn cold and blue. °Keep the sling clean. °If the sling is not waterproof: °Do not let it get wet. Remove it to shower or bathe. °Move your arm as little as possible, but keep your hand moving to prevent swelling. °Managing pain, stiffness, and swelling ° °If directed, put ice on the painful area: °Put ice in a plastic bag. °Place a towel between your skin and the bag. °Leave the ice on for 20 minutes, 2-3 times per day. Stop applying ice if it does not help with the pain. °Squeeze a soft ball or a foam pad as much as possible. This helps to keep the shoulder from swelling. It also helps to strengthen the arm. °General instructions °Take over-the-counter and prescription medicines only as told by your health care provider. °Keep all follow-up visits as told by your health care provider. This is important. °Contact a health care provider if: °Your pain gets worse. °Your pain is not relieved with medicines. °New pain develops in your arm, hand, or fingers. °Get help right away if: °Your arm, hand, or fingers: °Tingle. °Become numb. °Become swollen. °Become painful. °Turn white or blue. °Summary °Shoulder pain can be caused by an injury, overuse, or arthritis. °Pay attention to changes in your symptoms. Let your health care provider know about them. °This condition may be treated with a sling, ice, and pain  medicines. °Contact your health care provider if the pain gets worse or new pain develops. Get help right away if your arm, hand, or fingers tingle or become numb, swollen, or painful. °Keep all follow-up visits as told by your health care provider. This is important. °This information is not intended to replace advice given to you by your health care provider. Make sure you discuss any questions you have with your health care provider. °Document Revised: 12/06/2017 Document Reviewed: 12/06/2017 °Elsevier Patient Education © 2022 [employer] Inc. ° °

## 2020-12-02 NOTE — Progress Notes (Signed)
Discuss HTN Concerns with cyst on right hand

## 2020-12-02 NOTE — Progress Notes (Signed)
[place]   Mr. Keith Mejia is a 60 y.o. male presents for hypertension evaluation, Denies shortness of breath, headaches, chest pain or lower extremity edema, sudden onset, vision changes, unilateral weakness, dizziness, paresthesias   Patient reports adherence with medications.  Dietary habits include: low sodium diet  Exercise habits include:walking  Family / Social history: Mother - T2D    Past Medical History:  Diagnosis Date   Arthritis 06-03-11   osteoarthritis-rt. hip, lt shoulder   Bronchitis 06-03-11   x1 -a few yrs ago, none recent   GERD (gastroesophageal reflux disease) 06-03-11   as needed Prilosec OTC   Headache(784.0) 06-03-11   sinus related, none recent   Past Surgical History:  Procedure Laterality Date   TOTAL HIP ARTHROPLASTY  06/04/2011   Procedure: TOTAL HIP ARTHROPLASTY ANTERIOR APPROACH;  Surgeon: Mcarthur Rossetti;  Location: [facility];  Service: Orthopedics;  Laterality: Right;  Right Total Hip Arthroplasty, Direct Anterior Approach    (c-arm)   No Known Allergies Current Outpatient Medications on File Prior to Visit  Medication Sig Dispense Refill   atorvastatin (LIPITOR) 40 MG tablet TAKE 1 TABLET BY MOUTH EVERY DAY 90 tablet 0   No current facility-administered medications on file prior to visit.   Social History   Socioeconomic History   Marital status: Married    Spouse name: Not on file   Number of children: Not on file   Years of education: Not on file   Highest education level: Not on file  Occupational History   Not on file  Tobacco Use   Smoking status: Every Day    Packs/day: 0.50    Years: 35.00    Pack years: 17.50    Types: Cigarettes   Smokeless tobacco: Never  Substance and Sexual Activity   Alcohol use: Yes    Comment: occasional   Drug use: Yes    Types: Marijuana   Sexual activity: Yes  Other Topics Concern   Not on file  Social History Narrative   Not on file   Social Determinants of  Health   Financial Resource Strain: Not on file  Food Insecurity: Not on file  Transportation Needs: Not on file  Physical Activity: Not on file  Stress: Not on file  Social Connections: Not on file  Intimate Partner Violence: Not on file   Family History  Problem Relation Age of Onset   Diabetes Mother    Cancer Father      OBJECTIVE:  Vitals:   12/02/20 1524  BP: 119/87  Pulse: 98  Resp: 16  Temp: (!) 97.5 F (36.4 C)  SpO2: 96%  Weight: 168 lb (76.2 kg)    Physical Exam Vitals reviewed.  Constitutional:      Appearance: Normal appearance.  HENT:     Head: Normocephalic.     Right Ear: Tympanic membrane and external ear normal.     Left Ear: Tympanic membrane and external ear normal.     Nose: Nose normal.  Eyes:     Extraocular Movements: Extraocular movements intact.     Pupils: Pupils are equal, round, and reactive to light.  Cardiovascular:     Rate and Rhythm: Normal rate and regular rhythm.  Pulmonary:     Effort: Pulmonary effort is normal.     Breath sounds: Normal breath sounds.  Abdominal:     General: Abdomen is flat. Bowel sounds are normal.     Palpations: Abdomen is soft.  Musculoskeletal:  General: Normal range of motion.     Cervical back: Normal range of motion and neck supple.  Skin:    General: Skin is warm and dry.  Neurological:     Mental Status: He is alert and oriented to person, place, and time.  Psychiatric:        Mood and Affect: Mood normal.        Behavior: Behavior normal.        Thought Content: Thought content normal.        Judgment: Judgment normal.    Review of Systems  Neurological:  Positive for headaches.  All other systems reviewed and are negative.  Last 3 Office BP readings: BP Readings from Last 3 Encounters:  12/02/20 119/87  08/04/20 108/71  07/03/20 (!) 159/91    BMET    Component Value Date/Time   NA 141 04/03/2020 0852   K 4.4 04/03/2020 0852   CL 105 04/03/2020 0852   CO2 22  04/03/2020 0852   GLUCOSE 91 04/03/2020 0852   GLUCOSE 110 (H) 06/05/2011 0443   BUN 11 04/03/2020 0852   CREATININE 1.08 04/03/2020 0852   CALCIUM 9.6 04/03/2020 0852   GFRNONAA 75 04/03/2020 0852   GFRAA 86 04/03/2020 0852    Renal function: CrCl cannot be calculated (Patient's most recent lab result is older than the maximum 21 days allowed.).  Clinical ASCVD: Yes  The 10-year ASCVD risk score Mikey Bussing DC Jr., et al., 2013) is: 21.1%   Values used to calculate the score:     Age: 31 years     Sex: Male     Is Non-Hispanic African American: Yes     Diabetic: No     Tobacco smoker: Yes     Systolic Blood Pressure: 734 mmHg     Is BP treated: Yes     HDL Cholesterol: 34 mg/dL     Total Cholesterol: 209 mg/dL  ASCVD risk factors include- [place]   ASSESSMENT & PLAN:  1. Tobacco dependence   2. Blood pressure check   3. Mixed hyperlipidemia   4. Need for Streptococcus pneumoniae vaccination     Meds ordered this encounter  Medications   losartan-hydrochlorothiazide (HYZAAR) 100-25 MG tablet    Sig: Take 1 tablet by mouth daily.    Dispense:  90 tablet    Refill:  3   Blood pressure check -     losartan-hydrochlorothiazide (HYZAAR) 100-25 MG tablet; Take 1 tablet by mouth daily. -     CMP14+EGFR  -Counseled on lifestyle modifications for blood pressure control including reduced dietary sodium, increased exercise, weight reduction and adequate sleep. Also, educated patient about the risk for cardiovascular events, stroke and heart attack. Also counseled patient about the importance of medication adherence. If you participate in smoking, it is important to stop using tobacco as this will increase the risks associated with uncontrolled blood pressure.   -Hypertension longstanding diagnosed currently Hyzaar 100/25 on current medications. Patient is adherent with current medications.   Goal BP:  For patients younger than 60: Goal BP < 130/80. For patients 60 and older: Goal BP  < 140/90. For patients with diabetes: Goal BP < 130/80. Your most recent BP: 119/87  Minimize salt intake. Minimize alcohol intake  Tobacco dependence - I have recommended complete cessation of tobacco use. I have discussed various options available for assistance with tobacco cessation including over the counter methods (Nicotine gum, patch and lozenges). We also discussed prescription options (Chantix, Nicotine Inhaler / Nasal Spray).  The patient is not interested in pursuing any prescription tobacco cessation options at this time. - Patient declines at this time.   Mixed hyperlipidemia Decrease your fatty foods, red meat, cheese, milk and increase fiber like whole grains and veggies.   Need for Streptococcus pneumoniae vaccination -     Pneumococcal conjugate vaccine 20-valent   Chronic left shoulder pain 7/10 referral to ortho  This note has been created with Surveyor, quantity. Any transcriptional errors are unintentional.   Kerin Perna, NP 12/02/2020, 3:49 PM

## 2020-12-03 LAB — CMP14+EGFR
ALT: 5 IU/L (ref 0–44)
AST: 25 IU/L (ref 0–40)
Albumin/Globulin Ratio: 1.6 (ref 1.2–2.2)
Albumin: 4.9 g/dL (ref 3.8–4.9)
Alkaline Phosphatase: 151 IU/L — ABNORMAL HIGH (ref 44–121)
BUN/Creatinine Ratio: 18 (ref 9–20)
BUN: 32 mg/dL — ABNORMAL HIGH (ref 6–24)
Bilirubin Total: 0.7 mg/dL (ref 0.0–1.2)
CO2: 21 mmol/L (ref 20–29)
Calcium: 9.7 mg/dL (ref 8.7–10.2)
Chloride: 99 mmol/L (ref 96–106)
Creatinine, Ser: 1.73 mg/dL — ABNORMAL HIGH (ref 0.76–1.27)
Globulin, Total: 3 g/dL (ref 1.5–4.5)
Glucose: 108 mg/dL — ABNORMAL HIGH (ref 65–99)
Potassium: 4.1 mmol/L (ref 3.5–5.2)
Sodium: 137 mmol/L (ref 134–144)
Total Protein: 7.9 g/dL (ref 6.0–8.5)
eGFR: 45 mL/min/{1.73_m2} — ABNORMAL LOW (ref >59)

## 2020-12-15 ENCOUNTER — Ambulatory Visit (INDEPENDENT_AMBULATORY_CARE_PROVIDER_SITE_OTHER): Payer: Medicare HMO

## 2020-12-15 ENCOUNTER — Ambulatory Visit (INDEPENDENT_AMBULATORY_CARE_PROVIDER_SITE_OTHER): Payer: Medicare HMO | Admitting: Orthopaedic Surgery

## 2020-12-15 DIAGNOSIS — M25511 Pain in right shoulder: Secondary | ICD-10-CM

## 2020-12-15 DIAGNOSIS — G8929 Other chronic pain: Secondary | ICD-10-CM

## 2020-12-15 DIAGNOSIS — Z96641 Presence of right artificial hip joint: Secondary | ICD-10-CM

## 2020-12-15 MED ORDER — METHYLPREDNISOLONE ACETATE 40 MG/ML IJ SUSP
40.0000 mg | INTRAMUSCULAR | Status: AC | PRN
Start: 2020-12-15 — End: 2020-12-15
  Administered 2020-12-15: 40 mg via INTRA_ARTICULAR

## 2020-12-15 MED ORDER — LIDOCAINE HCL 1 % IJ SOLN
3.0000 mL | INTRAMUSCULAR | Status: AC | PRN
  Administered 2020-12-15: 3 mL

## 2020-12-15 NOTE — Progress Notes (Signed)
Office Visit Note   Patient: Keith Mejia           Date of Birth: 1961-03-24           MRN: [id number] Visit Date: 12/15/2020              Requested by: Kerin Perna, NP [address] [place],  [place] [address] PCP: Kerin Perna, NP   Assessment & Plan: Visit Diagnoses:  1. History of right hip replacement   2. Chronic right shoulder pain     Plan: I did recommend outpatient physical therapy for strengthening the right shoulder as well as the right hip and right quads.  I did recommend a steroid injection in the subacromial outlet of the right shoulder which he tolerated well.  All questions and concerns were answered and addressed.  We will see him back in 6 weeks after course of physical therapy.  Follow-Up Instructions: Return in about 6 weeks (around 01/26/2021).   Orders:  Orders Placed This Encounter  Procedures   Large Joint Inj   XR HIP UNILAT W OR W/O PELVIS 1V RIGHT   XR Shoulder Right   No orders of the defined types were placed in this encounter.     Procedures: Large Joint Inj: R subacromial bursa on 12/15/2020 10:23 AM Indications: pain and diagnostic evaluation Details: 22 G 1.5 in needle  Arthrogram: No  Medications: 3 mL lidocaine 1 %; 40 mg methylPREDNISolone acetate 40 MG/ML Outcome: tolerated well, no immediate complications Procedure, treatment alternatives, risks and benefits explained, specific risks discussed. Consent was given by the patient. Immediately prior to procedure a time out was called to verify the correct patient, procedure, equipment, support staff and site/side marked as required. Patient was prepped and draped in the usual sterile fashion.      Clinical Data: No additional findings.   Subjective: Chief Complaint  Patient presents with   Right Shoulder - Pain   Right Hip - Pain  The patient is someone of seen before.  It has been 10 years since we replaced his right hip.  He does report some right hip  weakness.  He is a [employer].  He is 60 years old.  He is also had some right shoulder weakness and pain.  He reports a right shoulder dislocation with rotator cuff repair many years ago.  He is never had surgery on the right shoulder.  He does hurt with overhead activities and is weak.  He also has a harder time going up and down ladders with his right hip.  He denies any groin pain or any significant pain around the right hip except for when he flexes the hip.  He is not a diabetic.  He denies any other acute change in his medical status.  He is a thin individual.  HPI  Review of Systems Today he denies any headache, chest pain, shortness of breath, fever, chills, nausea, vomiting  Objective: Vital Signs: There were no vitals taken for this visit.  Physical Exam He is alert and oriented x3 and in no acute distress Ortho Exam Examination of his right shoulder does show that he slightly uses his deltoid abduct the right shoulder.  There is some weakness on abduction as well as external rotation.  His internal rotation with adduction is also less than his left side.  There still is some residual strength in the rotator cuff and the shoulder is well located.  Examination of his right hip shows some  weakness in hip flexors.  He has full and fluid range of motion of the right hip and no pain in the groin at all no blocks to rotation. Specialty Comments:  No specialty comments available.  Imaging: XR HIP UNILAT W OR W/O PELVIS 1V RIGHT  Result Date: 12/15/2020 An AP pelvis and lateral right hip shows a total hip arthroplasty with no complicating features on the right side.  XR Shoulder Right  Result Date: 12/15/2020 3 views of the right shoulder show significant AC joint arthritic changes.  There is also calcifications around the footprint of the rotator cuff with its attachment to the greater tuberosity.    PMFS History: There are no problems to display for this patient.  Past Medical History:   Diagnosis Date   Arthritis 06-03-11   osteoarthritis-rt. hip, lt shoulder   Bronchitis 06-03-11   x1 -a few yrs ago, none recent   GERD (gastroesophageal reflux disease) 06-03-11   as needed Prilosec OTC   Headache(784.0) 06-03-11   sinus related, none recent    Family History  Problem Relation Age of Onset   Diabetes Mother    Cancer Father     Past Surgical History:  Procedure Laterality Date   TOTAL HIP ARTHROPLASTY  06/04/2011   Procedure: TOTAL HIP ARTHROPLASTY ANTERIOR APPROACH;  Surgeon: Mcarthur Rossetti;  Location: [facility];  Service: Orthopedics;  Laterality: Right;  Right Total Hip Arthroplasty, Direct Anterior Approach    (c-arm)   Social History   Occupational History   Not on file  Tobacco Use   Smoking status: Every Day    Packs/day: 0.50    Years: 35.00    Pack years: 17.50    Types: Cigarettes   Smokeless tobacco: Never  Substance and Sexual Activity   Alcohol use: Yes    Comment: occasional   Drug use: Yes    Types: Marijuana   Sexual activity: Yes

## 2020-12-16 ENCOUNTER — Other Ambulatory Visit: Payer: Self-pay

## 2020-12-16 DIAGNOSIS — Z96641 Presence of right artificial hip joint: Secondary | ICD-10-CM

## 2020-12-16 DIAGNOSIS — G8929 Other chronic pain: Secondary | ICD-10-CM

## 2020-12-16 DIAGNOSIS — M25511 Pain in right shoulder: Secondary | ICD-10-CM

## 2020-12-29 ENCOUNTER — Encounter: Payer: Self-pay | Admitting: Physical Therapy

## 2020-12-29 ENCOUNTER — Other Ambulatory Visit: Payer: Self-pay

## 2020-12-29 ENCOUNTER — Ambulatory Visit: Payer: Medicare HMO | Admitting: Physical Therapy

## 2020-12-29 DIAGNOSIS — M25611 Stiffness of right shoulder, not elsewhere classified: Secondary | ICD-10-CM | POA: Diagnosis not present

## 2020-12-29 DIAGNOSIS — G8929 Other chronic pain: Secondary | ICD-10-CM | POA: Diagnosis not present

## 2020-12-29 DIAGNOSIS — R262 Difficulty in walking, not elsewhere classified: Secondary | ICD-10-CM

## 2020-12-29 DIAGNOSIS — M6281 Muscle weakness (generalized): Secondary | ICD-10-CM

## 2020-12-29 DIAGNOSIS — M25511 Pain in right shoulder: Secondary | ICD-10-CM

## 2020-12-29 DIAGNOSIS — M25551 Pain in right hip: Secondary | ICD-10-CM | POA: Diagnosis not present

## 2020-12-29 NOTE — Patient Instructions (Signed)
Access Code: [email or web address] URL: [email or web address] Date: 12/29/2020 Prepared by: Kearney Hard  Exercises Supine Shoulder Flexion Extension AAROM with Dowel - 2-3 x daily - 7 x weekly - 2 sets - 10 reps Supine Shoulder External Rotation in 45 Degrees Abduction AAROM with Dowel - 2-3 x daily - 7 x weekly - 2 sets - 10 reps Supine Figure 4 Piriformis Stretch - 2-3 x daily - 7 x weekly - 3-4 reps - 20 seconds hold Supine Piriformis Stretch with Foot on Ground - 2-3 x daily - 7 x weekly - 3-4 reps - 20 seconds hold Supine Straight Leg Raises - 2-3 x daily - 7 x weekly - 2 sets - 10 reps - 2 seconds hold Seated Hamstring Stretch - 2-3 x daily - 7 x weekly - 3-4 reps - 20 seconds hold

## 2020-12-29 NOTE — Therapy (Addendum)
[facility] [address] [place], [place], [address] Phone: [phone]   Fax:  [phone]  Physical Therapy Evaluation Discharge on 01/12/2021  Patient Details  Name: Keith Mejia MRN: [id number] Date of Birth: 03/24/1961 Referring Provider (PT): blackman, christopher MD   Encounter Date: 12/29/2020   PT End of Session - 12/29/20 0946     Visit Number 1    Number of Visits 12    Date for PT Re-Evaluation 02/13/21    Authorization Type humana    Progress Note Due on Visit 10    PT Start Time 0930    PT Stop Time 1013    PT Time Calculation (min) 43 min    Activity Tolerance Patient tolerated treatment well    Behavior During Therapy [facility] for tasks assessed/performed             Past Medical History:  Diagnosis Date   Arthritis 06-03-11   osteoarthritis-rt. hip, lt shoulder   Bronchitis 06-03-11   x1 -a few yrs ago, none recent   GERD (gastroesophageal reflux disease) 06-03-11   as needed Prilosec OTC   Headache(784.0) 06-03-11   sinus related, none recent    Past Surgical History:  Procedure Laterality Date   TOTAL HIP ARTHROPLASTY  06/04/2011   Procedure: TOTAL HIP ARTHROPLASTY ANTERIOR APPROACH;  Surgeon: Mcarthur Rossetti;  Location: [facility];  Service: Orthopedics;  Laterality: Right;  Right Total Hip Arthroplasty, Direct Anterior Approach    (c-arm)    There were no vitals filed for this visit.    Subjective Assessment - 12/29/20 0936     Subjective Pt arriving reporting right shoulder pain of 7/10 which has been ongoing. Pt s/p injection which pt stated only helped a couple of days. Pt also reporting rigth hip pain which has been ongoing since total hip replacement in 2012. Pt also reporting hisotry of right shoulder dislocation and rotator cuff tear in 2000.    Pertinent History arthritis, bronchitis, right THA, GERD, headaches    Limitations Walking;House hold activities;Lifting    How long can you walk  comfortably? 15-20 minutes    Diagnostic tests x-ray, normal alignment of right hip/pelvis,AC joint arthritis, calcification around rotator cuff to the great tuberosity    Currently in Pain? Yes    Pain Score 7     Pain Location Shoulder    Pain Orientation Right    Pain Descriptors / Indicators Aching;Sore    Pain Type Chronic pain    Pain Onset More than a month ago    Pain Frequency Intermittent    Aggravating Factors  reaching, lifting    Pain Relieving Factors resting    Effect of Pain on Daily Activities difficluty wtih reaching and lifting    Multiple Pain Sites Yes    Pain Score 5    Pain Location Hip    Pain Orientation Right    Pain Type Chronic pain    Pain Onset More than a month ago    Pain Frequency Intermittent    Aggravating Factors  walking prolonged    Pain Relieving Factors sitting, resting, changing postions    Effect of Pain on Daily Activities prolonged walking                [facility] PT Assessment - 12/29/20 0001       Assessment   Medical Diagnosis M25.511, chronic right shoulde rpain, Z96.641 history of right hip replecement    Referring Provider (PT) blackman, christopher MD  Onset Date/Surgical Date --   years ago   Hand Dominance Right    Prior Therapy yes following right THA in 2012      Precautions   Precautions None      Restrictions   Weight Bearing Restrictions No      Balance Screen   Has the patient fallen in the past 6 months No    Is the patient reluctant to leave their home because of a fear of falling?  No      Home Environment   Living Environment Private residence    Living Arrangements Spouse/significant other    Type of [place] to enter    Entrance Stairs-Number of Steps 3    [place] reach both      Prior Function   Level of [place] Retired    Leisure walking in neighborhood      Cognition   Overall Cognitive Status Within Functional  Limits for tasks assessed      Observation/Other Assessments   Focus on Therapeutic Outcomes (FOTO)  50 (predicted 51)      Posture/Postural Control   Posture/Postural Control Postural limitations    Postural Limitations Rounded Shoulders      ROM / Strength   AROM / PROM / Strength AROM;Strength      AROM   AROM Assessment Site Shoulder;Hip    Right/Left Shoulder Right;Left    Right Shoulder Extension 40 Degrees    Right Shoulder Flexion 120 Degrees    Right Shoulder ABduction 150 Degrees    Right Shoulder Internal Rotation 50 Degrees   with pain   Right Shoulder External Rotation 70 Degrees   with pain   Left Shoulder Extension 40 Degrees    Left Shoulder Flexion 160 Degrees    Left Shoulder ABduction 162 Degrees    Left Shoulder Internal Rotation 80 Degrees    Left Shoulder External Rotation 88 Degrees    Right/Left Hip Right;Left    Right Hip Flexion 100    Right Hip External Rotation  30    Right Hip ABduction 35    Left Hip Flexion 115    Left Hip External Rotation  45    Left Hip ABduction 45      Strength   Overall Strength Comments all right sided testing in shoulder and hip brought on pain    Strength Assessment Site Shoulder;Hip;Knee    Right/Left Shoulder Right;Left    Right Shoulder Flexion 3+/5    Right Shoulder Extension 5/5    Right Shoulder ABduction 3+/5    Right Shoulder Internal Rotation 4-/5    Right Shoulder External Rotation 4-/5    Left Shoulder Flexion 5/5    Left Shoulder Extension 5/5    Left Shoulder ABduction 5/5    Left Shoulder Internal Rotation 5/5    Left Shoulder External Rotation 5/5    Right/Left Hip Right;Left    Right Hip Flexion 4/5    Right Hip Extension 4/5    Right Hip External Rotation  4/5    Right Hip Internal Rotation 4/5    Right Hip ABduction 4/5    Right Hip ADduction 4/5    Left Hip Flexion 5/5    Left Hip Extension 5/5    Left Hip External Rotation 5/5    Left Hip Internal Rotation 5/5    Left Hip ABduction  5/5    Left Hip ADduction 5/5  Right/Left Knee Right;Left    Right Knee Flexion 4/5    Right Knee Extension 4/5    Left Knee Flexion 5/5    Left Knee Extension 5/5      Palpation   Palpation comment TTP: lateral right hip, right anterior and lateral shoulder      Special Tests   Other special tests position empty can on right.      Transfers   Five time sit to stand comments  15 seconds with no UE support      Ambulation/Gait   Gait Pattern Within Functional Limits;Decreased hip/knee flexion - right                        Objective measurements completed on examination: See above findings.               PT Education - 12/29/20 0945     Education Details PT POC, HEP    Person(s) Educated Patient    Methods Explanation;Demonstration;Handout;Verbal cues;Tactile cues    Comprehension Verbalized understanding;Returned demonstration              PT Short Term Goals - 12/29/20 0951       PT SHORT TERM GOAL #1   Title Pt will be independent in his initial HEP.    Time 3    Period Weeks    Status New    Target Date 01/23/21      PT SHORT TERM GOAL #2   Title Pt will report being able to walk for 10 minutes with pain </= 3/10.    Time 3    Period Weeks    Status New               PT Long Term Goals - 12/29/20 1230       PT LONG TERM GOAL #1   Title Pt will be independent in his HEP and progression.    Time 6    Period Weeks    Status New    Target Date 02/13/21      PT LONG TERM GOAL #2   Title Pt will be able to improve his right shoulder AROM to >/= 150 degrees with pain </= 2/10.    Time 6    Period Weeks    Status New    Target Date 02/13/21      PT LONG TERM GOAL #3   Title Pt will improve his right hip strength to 5/5  for improved functional mobility.    Time 6    Period Weeks    Status New    Target Date 02/13/21      PT LONG TERM GOAL #4   Title Pt will be able to lift 15# from floor to counter height with  no pain.    Time 6    Period Weeks    Status New    Target Date 02/13/21      PT LONG TERM GOAL #5   Title Pt will be able to navigate a flight of stairs with no rail with pain </= 2/10 in right hip.      Additional Long Term Goals   Additional Long Term Goals Yes      PT LONG TERM GOAL #6   Title Pt will improve his FOTO score to >/= 60% function.    Baseline 50%    Time 6    Period Weeks    Status New    Target  Date 02/13/21                    Plan - 12/29/20 1014     Clinical Impression Statement Pt arriving today for PT evaluation for chronic right shoulder pain and chronic right hip pain. Pt s/p right THA in 2012 and s/p right shoulder dislocation in 2000. Pt reporting ongoing pain since both events which has gradually worsened. Pt stating at times he is unable to lift his right leg when stepping onto a step. Pt with mild weakness noted with MMT of grossly 4/5 strength in right hip and 3-4/5 strength in right shoulder. Pain noted with MMT. Pt with positive emphy can test of right shoulder. X-ray of right shoulder revealed AC joint arthritis, calcification around rotator cuff to the great tuberosity. Pt with limitations in right shoulder ROM as well as mild limitiation in right hip. Pt was issued a beginning HEP and following demonstrations pt reporting his hip already feels better. Skilled PT needed to address pt's impairments with below interventions.    Personal Factors and Comorbidities Comorbidity 3+    Comorbidities s/p right THA, arthrtiis, h/o shoulder dislocation on right, bronchitis, HTN, GERD, headaches,    Examination-Activity Limitations Carry;Lift;Reach Overhead;Squat;Stand;Stairs    Examination-Participation Restrictions Community Activity;Other;Yard Work    Stability/Clinical Decision Making Stable/Uncomplicated    Clinical Decision Making Low    Rehab Potential Good    PT Frequency 2x / week    PT Duration 6 weeks    PT Treatment/Interventions  ADLs/Self Care Home Management;Cryotherapy;Electrical Stimulation;Iontophoresis 37m/ml Dexamethasone;Moist Heat;Ultrasound;Balance training;Therapeutic exercise;Therapeutic activities;Functional mobility training;Stair training;Gait training;Neuromuscular re-education;Patient/family education;Passive range of motion;Manual techniques;Dry needling;Taping    PT Next Visit Plan Nustep with UE's, right hip strengthening, right shoulder ROM/ strenghtening, shoulder mobs and manual as needed.    PT Home Exercise Plan Access Code: [email or web address] URL: [email or web address]  Date: 12/29/2020  Prepared by: JKearney Hard   Exercises  Supine Shoulder Flexion Extension AAROM with Dowel - 2-3 x daily - 7 x weekly - 2 sets - 10 reps  Supine Shoulder External Rotation in 45 Degrees Abduction AAROM with Dowel - 2-3 x daily - 7 x weekly - 2 sets - 10 reps  Supine Figure 4 Piriformis Stretch - 2-3 x daily - 7 x weekly - 3-4 reps - 20 seconds hold  Supine Piriformis Stretch with Foot on Ground - 2-3 x daily - 7 x weekly - 3-4 reps - 20 seconds hold  Supine Straight Leg Raises - 2-3 x daily - 7 x weekly - 2 sets - 10 reps - 2 seconds hold  Seated Hamstring Stretch - 2-3 x daily - 7 x weekly - 3-4 reps - 20 seconds hold    Consulted and Agree with Plan of Care Patient             Patient will benefit from skilled therapeutic intervention in order to improve the following deficits and impairments:  Pain, Postural dysfunction, Decreased strength, Decreased balance, Decreased activity tolerance, Impaired UE functional use  Visit Diagnosis: Chronic right shoulder pain  Pain in right hip  Stiffness of right shoulder, not elsewhere classified  Muscle weakness (generalized)  Difficulty in walking, not elsewhere classified   Referring diagnosis? Z96.641, M25.511 Treatment diagnosis? (if different than referring diagnosis) m25.511, M25.551, M25.611, M62.81, R26.2 What was this (referring dx) caused by? []   Surgery []  Fall []  Ongoing issue [x]  Arthritis []  Other: ____________  Laterality: [x]  Rt []  Lt []  Both  Check all possible  CPT codes:      [x]  97110 (Therapeutic Exercise)  []  92507 (SLP Treatment)  [x]  97112 (Neuro Re-ed)   []  92526 (Swallowing Treatment)   [x]  84128 (Gait Training)   []  [phone] (Cognitive Training, 1st 15 minutes) [x]  97140 (Manual Therapy)   []  97130 (Cognitive Training, each add'l 15 minutes)  [x]  97530 (Therapeutic Activities)  []  Other, List CPT Code ____________    [x]  88719 (Self Care)       []  All codes above (97110 - 97535)  []  97012 (Mechanical Traction)  [x]  97014 (E-stim Unattended)  []  97032 (E-stim manual)  [x]  97033 (Ionto)  [x]  97035 (Ultrasound)  []  97760 (Orthotic Fit) []  [id number] (Physical Performance Training) []  [id number] (Aquatic Therapy) []  [id number] (Contrast Bath) []  [id number] (Paraffin) []  97597 (Wound Care 1st 20 sq cm) []  97598 (Wound Care each add'l 20 sq cm) []  97016 (Vasopneumatic Device) []  [id number] (Orthotic Training) []  [id number] (Prosthetic Training)   Problem List There are no problems to display for this patient. PHYSICAL THERAPY DISCHARGE SUMMARY  Visits from Start of Care: 1  Current functional level related to goals / functional outcomes: See above   Remaining deficits: See above   Education / Equipment: HEP   Patient agrees to discharge. Patient goals were not met. Patient is being discharged due to not returning since the last visit. I called pt after he didn't show for 2 consecutive visits. Pt stated he had, "too much going on in his life right now". Pt was instructed to contact his physician if further need arises.   Oretha Caprice, PT, MPT 12/29/2020, 12:44 PM  [facility] [address] [place], [place], [address] Phone: [phone]   Fax:  [phone]  Name: Keith Mejia MRN: [id number] Date of Birth: [birth year]

## 2021-01-08 ENCOUNTER — Telehealth: Payer: Self-pay | Admitting: Rehabilitative and Restorative Service Providers"

## 2021-01-08 ENCOUNTER — Encounter: Payer: Medicare HMO | Admitting: Rehabilitative and Restorative Service Providers"

## 2021-01-08 NOTE — Telephone Encounter (Signed)
Spoke with Iona Beard.  He said his appointment today "slipped (his) mind."  Reminded him of his Monday appointment at 10:15 AM with Delsa Sale.

## 2021-01-12 ENCOUNTER — Telehealth: Payer: Self-pay | Admitting: Physical Therapy

## 2021-01-12 ENCOUNTER — Encounter: Payer: Medicare HMO | Admitting: Physical Therapy

## 2021-01-12 NOTE — Telephone Encounter (Signed)
I called pt after he missed his 10:15 appointment today. Pt stated, "I have a lot going on in my life right now and I am going to have to cancel the rest of my appointments. All further PT appointments have been cancelled and pt advised if he needs anything in the further to reach out to his physician for new referral.   Kearney Hard, PT, MPT 01/12/21 12:08 PM

## 2021-01-14 ENCOUNTER — Encounter: Payer: Medicare HMO | Admitting: Physical Therapy

## 2021-01-19 ENCOUNTER — Encounter: Payer: Medicare HMO | Admitting: Physical Therapy

## 2021-01-22 ENCOUNTER — Encounter: Payer: Medicare HMO | Admitting: Rehabilitative and Restorative Service Providers"

## 2021-01-26 ENCOUNTER — Ambulatory Visit: Payer: Medicare HMO | Admitting: Orthopaedic Surgery

## 2021-01-26 ENCOUNTER — Encounter: Payer: Medicare HMO | Admitting: Physical Therapy

## 2021-06-03 ENCOUNTER — Ambulatory Visit (INDEPENDENT_AMBULATORY_CARE_PROVIDER_SITE_OTHER): Payer: Medicare HMO | Admitting: Primary Care

## 2021-08-28 ENCOUNTER — Ambulatory Visit (INDEPENDENT_AMBULATORY_CARE_PROVIDER_SITE_OTHER): Payer: Medicare HMO

## 2021-08-28 ENCOUNTER — Other Ambulatory Visit (INDEPENDENT_AMBULATORY_CARE_PROVIDER_SITE_OTHER): Payer: Self-pay | Admitting: Primary Care

## 2021-08-28 ENCOUNTER — Encounter (INDEPENDENT_AMBULATORY_CARE_PROVIDER_SITE_OTHER): Payer: Self-pay | Admitting: Primary Care

## 2021-08-28 ENCOUNTER — Ambulatory Visit (INDEPENDENT_AMBULATORY_CARE_PROVIDER_SITE_OTHER): Payer: Medicare HMO | Admitting: Primary Care

## 2021-08-28 ENCOUNTER — Other Ambulatory Visit: Payer: Self-pay

## 2021-08-28 VITALS — BP 152/89 | HR 77 | Temp 98.4°F | Ht 72.0 in | Wt 180.2 lb

## 2021-08-28 DIAGNOSIS — Z79899 Other long term (current) drug therapy: Secondary | ICD-10-CM

## 2021-08-28 DIAGNOSIS — Z23 Encounter for immunization: Secondary | ICD-10-CM | POA: Diagnosis not present

## 2021-08-28 DIAGNOSIS — E782 Mixed hyperlipidemia: Secondary | ICD-10-CM | POA: Diagnosis not present

## 2021-08-28 DIAGNOSIS — Z0001 Encounter for general adult medical examination with abnormal findings: Secondary | ICD-10-CM | POA: Diagnosis not present

## 2021-08-28 DIAGNOSIS — I10 Essential (primary) hypertension: Secondary | ICD-10-CM | POA: Diagnosis not present

## 2021-08-28 DIAGNOSIS — R351 Nocturia: Secondary | ICD-10-CM

## 2021-08-28 DIAGNOSIS — Z1211 Encounter for screening for malignant neoplasm of colon: Secondary | ICD-10-CM

## 2021-08-28 DIAGNOSIS — Z131 Encounter for screening for diabetes mellitus: Secondary | ICD-10-CM

## 2021-08-28 DIAGNOSIS — Z0000 Encounter for general adult medical examination without abnormal findings: Secondary | ICD-10-CM

## 2021-08-28 NOTE — Progress Notes (Signed)
? ?Subjective:  ? Keith Mejia is a 61 y.o. male who presents for an Initial Medicare Annual Wellness Visit. ? ?Review of Systems    ?Review of Systems  ?All other systems reviewed and are negative.  ? ?   ?Objective:  ?  ?BP (!) 152/89 (BP Location: Right Arm, Patient Position: Sitting, Cuff Size: Normal)   Pulse 77   Temp 98.4 ?F (36.9 ?C) (Oral)   Ht 6' (1.829 m)   Wt 180 lb 3.2 oz (81.7 kg)   PF 95 L/min   BMI 24.44 kg/m?   ? ? ?  12/29/2020  ?  9:45 AM 09/10/2016  ?  8:58 AM 09/09/2016  ?  9:37 AM 06/04/2011  ?  1:45 PM 06/03/2011  ?  9:11 AM  ?Advanced Directives  ?Does Patient Have a Medical Advance Directive? No No No Patient does not have advance directive;Patient would not like information Patient does not have advance directive  ?Would patient like information on creating a medical advance directive? No - Patient declined No - Patient declined No - Patient declined    ?Pre-existing out of facility DNR order (yellow form or pink MOST form)     No  ? ? ?Current Medications (verified) ?Outpatient Encounter Medications as of 08/28/2021  ?Medication Sig  ? atorvastatin (LIPITOR) 40 MG tablet TAKE 1 TABLET BY MOUTH EVERY DAY  ? losartan-hydrochlorothiazide (HYZAAR) 100-25 MG tablet Take 1 tablet by mouth daily.  ? ?No facility-administered encounter medications on file as of 08/28/2021.  ? ?Physical exam: ?General: Vital signs reviewed.  Patient is well-developed and well-nourished, male in no acute distress and cooperative with exam. ?Head: Normocephalic and atraumatic. ?Eyes: EOMI, conjunctivae normal, no scleral icterus. ?Neck: Supple, trachea midline, normal ROM, no JVD, masses, thyromegaly, or carotid bruit present. ?Cardiovascular: RRR, S1 normal, S2 normal, no murmurs, gallops, or rubs. ?Pulmonary/Chest: Clear to auscultation bilaterally, no wheezes, rales, or rhonchi. ?Abdominal: Soft, non-tender, non-distended, BS +, no masses, organomegaly, or guarding present. ?Musculoskeletal: No joint  deformities, erythema, or stiffness, ROM full and nontender. ?Extremities: No lower extremity edema bilaterally ?Neurological: A&O x3, Strength is normal ?Skin: Warm, dry and intact. No rashes or erythema. ?Psychiatric: Normal mood and affect. speech and behavior is normal. Cognition and memory are normal. ?   ?Allergies (verified) ?Patient has no known allergies.  ? ?History: ?Past Medical History:  ?Diagnosis Date  ? Arthritis 06-03-11  ? osteoarthritis-rt. hip, lt shoulder  ? Bronchitis 06-03-11  ? x1 -a few yrs ago, none recent  ? GERD (gastroesophageal reflux disease) 06-03-11  ? as needed Prilosec OTC  ? MWUXLKGM(010.2) 06-03-11  ? sinus related, none recent  ? ?Past Surgical History:  ?Procedure Laterality Date  ? TOTAL HIP ARTHROPLASTY  06/04/2011  ? Procedure: TOTAL HIP ARTHROPLASTY ANTERIOR APPROACH;  Surgeon: Mcarthur Rossetti;  Location: [facility];  Service: Orthopedics;  Laterality: Right;  Right Total Hip Arthroplasty, Direct Anterior Approach    (c-arm)  ? ?Family History  ?Problem Relation Age of Onset  ? Diabetes Mother   ? Cancer Father   ? ?Social History  ? ?Socioeconomic History  ? Marital status: Married  ?  Spouse name: Not on file  ? Number of children: Not on file  ? Years of education: Not on file  ? Highest education level: Not on file  ?Occupational History  ? Not on file  ?Tobacco Use  ? Smoking status: Every Day  ?  Packs/day: 0.50  ?  Years: 35.00  ?  Pack years: 17.50  ?  Types: Cigarettes  ? Smokeless tobacco: Never  ?Substance and Sexual Activity  ? Alcohol use: Yes  ?  Comment: occasional  ? Drug use: Yes  ?  Types: Marijuana  ? Sexual activity: Yes  ?Other Topics Concern  ? Not on file  ?Social History Narrative  ? Not on file  ? ?Social Determinants of Health  ? ?Financial Resource Strain: Not on file  ?Food Insecurity: Not on file  ?Transportation Needs: Not on file  ?Physical Activity: Not on file  ?Stress: Not on file  ?Social Connections: Not on file  ? ? ?Tobacco  Counseling ?Ready to quit:yes but some days are more stressful than others  ?Counseling given:Yes   ? ? ?Clinical Intake: ? ?Pre-visit preparation completed: Yes ? ?Pain : No/denies pain ? ?  ? ?Diabetes: No ? ?How often do you need to have someone help you when you read instructions, pamphlets, or other written materials from your doctor or pharmacy?: 1 - Never ? ?Diabetic No ? ?Interpreter Needed?: No ? ?  ? ? ?Activities of Daily Living ?   ? View : No data to display.  ?  ?  ?  ? ? ?Patient Care Team: ?Kerin Perna, NP as PCP - General (Internal Medicine) ? ?Indicate any recent Medical Services you may have received from other than [facility] providers in the past year (date may be approximate). ? ?   ?Assessment:  ? This is a routine wellness examination for Keith Mejia. ? ?Hearing/Vision screen ?Right- 20/20 Left 20/25 without wearing his glasses  ?Whisper test negative ?Dietary issues and exercise activities discussed: ? Yes ? ? Goals Addressed   ?None ?  ?Depression Screen ? ?  12/02/2020  ?  3:31 PM 08/04/2020  ?  2:03 PM 07/03/2020  ?  3:47 PM 04/02/2020  ?  2:57 PM 02/26/2019  ?  8:49 AM 08/17/2018  ?  2:07 PM 09/10/2016  ?  8:58 AM  ?PHQ 2/9 Scores  ?PHQ - 2 Score 0 0 0 0 0 0 0  ?PHQ- 9 Score 0        ?  ?Fall Risk ? ?  12/02/2020  ?  3:31 PM 08/04/2020  ?  2:03 PM 07/03/2020  ?  3:46 PM 02/26/2019  ?  8:39 AM 08/17/2018  ?  2:07 PM  ?Fall Risk   ?Falls in the past year? 0 0 0 0 0  ?Number falls in past yr: 0      ?Injury with Fall? 0      ?Risk for fall due to : No Fall Risks      ?Follow up Falls evaluation completed      ? ? ?FALL RISK PREVENTION PERTAINING TO THE HOME: ? ?Any stairs in or around the home? No  ?If so, are there any without handrails?  No stairs in or around home ?Home free of loose throw rugs in walkways, pet beds, electrical cords, etc? Yes  ?Adequate lighting in your home to reduce risk of falls? Yes  ? ?ASSISTIVE DEVICES UTILIZED TO PREVENT FALLS: ? ?Life alert? No  ?Use of a cane, walker or w/c?  No  ?Grab bars in the bathroom? No  ?Shower chair or bench in shower? No  ?Elevated toilet seat or a handicapped toilet? No  ? ?TIMED UP AND GO: ? ?Was the test performed? Yes .  ?Length of time to ambulate 10 feet: 05.26 sec.  ? ?Gait steady and fast without use  of assistive device ? ?Cognitive Function: ?  ? ?Immunizations ?Immunization History  ?Administered Date(s) Administered  ? Influenza,inj,Quad PF,6+ Mos 08/17/2018, 02/26/2019, 04/02/2020  ? PFIZER(Purple Top)SARS-COV-2 Vaccination 11/07/2019, 12/05/2019  ? PNEUMOCOCCAL CONJUGATE-20 12/02/2020  ? Tdap 08/17/2018  ? ? ?TDAP status: Up to date ? ?Flu Vaccine status: Completed at today's visit ? ?Pneumococcal vaccine status: Up to date ? ?Covid-19 vaccine status: Completed vaccines ? ?Qualifies for Shingles Vaccine? Yes   ?Zostavax completed  office stocks shingrix   ?Shingrix Completed?: No.    Education has been provided regarding the importance of this vaccine. Patient has been advised to call insurance company to determine out of pocket expense if they have not yet received this vaccine. Advised may also receive vaccine at local pharmacy or Health Dept. Verbalized acceptance and understanding. ? ?Screening Tests ?Health Maintenance  ?Topic Date Due  ? Zoster Vaccines- Shingrix (1 of 2) Never done  ? COLONOSCOPY (Pts 45-62yr Insurance coverage will need to be confirmed)  Never done  ? COVID-19 Vaccine (3 - Pfizer risk series) 01/02/2020  ? INFLUENZA VACCINE  01/05/2021  ? COLON CANCER SCREENING ANNUAL FOBT  04/03/2021  ? TETANUS/TDAP  08/16/2028  ? Hepatitis C Screening  Completed  ? HIV Screening  Completed  ? HPV VACCINES  Aged Out  ? ? ?Health Maintenance ? ?Health Maintenance Due  ?Topic Date Due  ? Zoster Vaccines- Shingrix (1 of 2) Never done  ? COLONOSCOPY (Pts 45-46yrInsurance coverage will need to be confirmed)  Never done  ? COVID-19 Vaccine (3 - Pfizer risk series) 01/02/2020  ? INFLUENZA VACCINE  01/05/2021  ? COLON CANCER SCREENING ANNUAL  FOBT  04/03/2021  ? ? ?Colorectal cancer screening: Type of screening: FOBT/FIT. Completed 04/03/2020. Repeat every 1 years ? ?Lung Cancer Screening: (Low Dose CT Chest recommended if Age 528-80ears, 30 pack-year currently smok

## 2021-08-28 NOTE — Addendum Note (Signed)
Addended by: Juluis Mire on: 08/28/2021 10:58 AM ? ? Modules accepted: Orders ? ?

## 2021-08-28 NOTE — Patient Instructions (Signed)

## 2021-08-29 LAB — LIPID PANEL
Chol/HDL Ratio: 5.3 ratio — ABNORMAL HIGH (ref 0.0–5.0)
Cholesterol, Total: 238 mg/dL — ABNORMAL HIGH (ref 100–199)
HDL: 45 mg/dL (ref >39)
LDL Chol Calc (NIH): 164 mg/dL — ABNORMAL HIGH (ref 0–99)
Triglycerides: 158 mg/dL — ABNORMAL HIGH (ref 0–149)
VLDL Cholesterol Cal: 29 mg/dL (ref 5–40)

## 2021-08-29 LAB — CMP14+EGFR
ALT: 6 IU/L (ref 0–44)
AST: 23 IU/L (ref 0–40)
Albumin/Globulin Ratio: 1.3 (ref 1.2–2.2)
Albumin: 4.5 g/dL (ref 3.8–4.9)
Alkaline Phosphatase: 138 IU/L — ABNORMAL HIGH (ref 44–121)
BUN/Creatinine Ratio: 9 — ABNORMAL LOW (ref 10–24)
BUN: 11 mg/dL (ref 8–27)
Bilirubin Total: 0.3 mg/dL (ref 0.0–1.2)
CO2: 21 mmol/L (ref 20–29)
Calcium: 10 mg/dL (ref 8.6–10.2)
Chloride: 102 mmol/L (ref 96–106)
Creatinine, Ser: 1.23 mg/dL (ref 0.76–1.27)
Globulin, Total: 3.5 g/dL (ref 1.5–4.5)
Glucose: 86 mg/dL (ref 70–99)
Potassium: 5.9 mmol/L — ABNORMAL HIGH (ref 3.5–5.2)
Sodium: 143 mmol/L (ref 134–144)
Total Protein: 8 g/dL (ref 6.0–8.5)
eGFR: 67 mL/min/{1.73_m2} (ref >59)

## 2021-08-29 LAB — PSA: Prostate Specific Ag, Serum: 1.9 ng/mL (ref 0.0–4.0)

## 2021-09-02 ENCOUNTER — Other Ambulatory Visit (INDEPENDENT_AMBULATORY_CARE_PROVIDER_SITE_OTHER): Payer: Self-pay | Admitting: Primary Care

## 2021-09-02 DIAGNOSIS — E782 Mixed hyperlipidemia: Secondary | ICD-10-CM

## 2021-09-02 MED ORDER — ATORVASTATIN CALCIUM 40 MG PO TABS: 40.0000 mg | ORAL_TABLET | Freq: Every day | ORAL | 1 refills | Status: AC

## 2021-09-03 ENCOUNTER — Telehealth (INDEPENDENT_AMBULATORY_CARE_PROVIDER_SITE_OTHER): Payer: Self-pay

## 2021-09-03 NOTE — Telephone Encounter (Signed)
Spoke with patient. He verified date of birth. Provided with normal results except for high cholesterol which increases risk for stroke and heart attack. Advised of medication being sent in and how to take. Provided diet advise as well. Patient verified understanding. Nat Christen, CMA  ?

## 2021-09-03 NOTE — Telephone Encounter (Signed)
-----   Message from Kerin Perna, NP sent at 09/02/2021  2:14 PM EDT ----- ?Labs are normal except Your cholesterol is high, Increase risk of heart attack and/or stroke.  ?To reduce your Cholesterol , Remember - more fruits and vegetables, more fish, and limit red meat and dairy products. ?More soy, nuts, beans, barley, lentils, oats and plant sterol ester enriched margarine instead of butter. ?I also encourage eliminating sugar and processed food. ?New script sent for atorvastatin '40mg'$  take at bedtime ?

## 2021-09-17 ENCOUNTER — Ambulatory Visit (INDEPENDENT_AMBULATORY_CARE_PROVIDER_SITE_OTHER): Payer: Medicare HMO | Admitting: Primary Care

## 2022-01-06 ENCOUNTER — Other Ambulatory Visit: Payer: Self-pay

## 2022-01-06 NOTE — Patient Outreach (Addendum)
[facility]) Care Management  01/06/2022  Keith Mejia Oct 31, 1960 [id number]   Telephone call to patient for nurse call.  No answer.  HIPAA compliant voice message left.    Plan: RN CM will attempt again within 4 business days and send letter.  4:33 pm Update patient noted to be [facility] patient. RN CM will close.    Keith Baseman, RN, MSN [facility] Care Management Care Management Coordinator Direct Line [phone] Toll Free: [phone]  Fax: [phone]

## 2022-01-12 ENCOUNTER — Ambulatory Visit: Payer: Self-pay

## 2022-02-16 ENCOUNTER — Other Ambulatory Visit (INDEPENDENT_AMBULATORY_CARE_PROVIDER_SITE_OTHER): Payer: Self-pay | Admitting: Primary Care

## 2022-02-16 DIAGNOSIS — Z0130 Encounter for examination of blood pressure without abnormal findings: Secondary | ICD-10-CM

## 2022-05-29 ENCOUNTER — Other Ambulatory Visit (INDEPENDENT_AMBULATORY_CARE_PROVIDER_SITE_OTHER): Payer: Self-pay | Admitting: Primary Care

## 2022-05-29 DIAGNOSIS — Z0130 Encounter for examination of blood pressure without abnormal findings: Secondary | ICD-10-CM

## 2022-06-02 NOTE — Telephone Encounter (Signed)
Requested medication (s) are due for refill today: Yes  Requested medication (s) are on the active medication list: Yes  Last refill:  12/02/20  Future visit scheduled: No  Notes to clinic:  Prescription expired.    Requested Prescriptions  Pending Prescriptions Disp Refills   losartan-hydrochlorothiazide (HYZAAR) 100-25 MG tablet [Pharmacy Med Name: LOSARTAN-HCTZ 100-25 MG TAB] 90 tablet 3    Sig: TAKE 1 TABLET BY MOUTH EVERY DAY     Cardiovascular: ARB + Diuretic Combos Failed - 05/29/2022  8:18 AM      Failed - K in normal range and within 180 days    Potassium  Date Value Ref Range Status  08/28/2021 5.9 (H) 3.5 - 5.2 mmol/L Final         Failed - Na in normal range and within 180 days    Sodium  Date Value Ref Range Status  08/28/2021 143 134 - 144 mmol/L Final         Failed - Cr in normal range and within 180 days    Creatinine, Ser  Date Value Ref Range Status  08/28/2021 1.23 0.76 - 1.27 mg/dL Final         Failed - eGFR is 10 or above and within 180 days    GFR calc Af Amer  Date Value Ref Range Status  04/03/2020 86 >59 mL/min/1.73 Final    Comment:    **In accordance with recommendations from the NKF-ASN Task force,**   [facility] is in the process of updating its eGFR calculation to the   2021 CKD-EPI creatinine equation that estimates kidney function   without a race variable.    GFR calc non Af Amer  Date Value Ref Range Status  04/03/2020 75 >59 mL/min/1.73 Final   eGFR  Date Value Ref Range Status  08/28/2021 67 >59 mL/min/1.73 Final         Failed - Last BP in normal range    BP Readings from Last 1 Encounters:  08/28/21 (!) 152/89         Failed - Valid encounter within last 6 months    Recent Outpatient Visits           1 year ago Tobacco dependence   [place], Michelle P, NP   1 year ago Mixed hyperlipidemia   [place], Michelle P, NP   1 year ago Ganglion cyst    [place] Kerin Perna, NP   2 years ago Blood pressure check   [place], Vance, NP   3 years ago Essential hypertension   [place] Kerin Perna, NP              Passed - Patient is not pregnant

## 2022-08-26 ENCOUNTER — Other Ambulatory Visit: Payer: Self-pay | Admitting: Family

## 2022-08-26 DIAGNOSIS — F17200 Nicotine dependence, unspecified, uncomplicated: Secondary | ICD-10-CM

## 2022-09-04 ENCOUNTER — Other Ambulatory Visit (INDEPENDENT_AMBULATORY_CARE_PROVIDER_SITE_OTHER): Payer: Self-pay | Admitting: Primary Care

## 2022-09-04 DIAGNOSIS — E782 Mixed hyperlipidemia: Secondary | ICD-10-CM

## 2022-09-30 ENCOUNTER — Inpatient Hospital Stay: Admission: RE | Admit: 2022-09-30 | Payer: Medicare HMO | Source: Ambulatory Visit

## 2022-10-21 ENCOUNTER — Encounter (INDEPENDENT_AMBULATORY_CARE_PROVIDER_SITE_OTHER): Payer: Self-pay

## 2022-11-10 ENCOUNTER — Ambulatory Visit: Payer: Medicare HMO | Admitting: Orthopaedic Surgery
